# Patient Record
Sex: Female | Born: 1990 | Race: Black or African American | Hispanic: No | Marital: Single | Smoking: Current every day smoker
Health system: Southern US, Community
[De-identification: ages and names within clinical notes are randomized; demographics above are authoritative.]

## PROBLEM LIST (undated history)

## (undated) DIAGNOSIS — J329 Chronic sinusitis, unspecified: Secondary | ICD-10-CM

## (undated) DIAGNOSIS — K219 Gastro-esophageal reflux disease without esophagitis: Secondary | ICD-10-CM

## (undated) HISTORY — PX: NASAL SINUS SURGERY: SHX719

## (undated) HISTORY — PX: TONSILLECTOMY: SUR1361

## (undated) HISTORY — PX: MOUTH SURGERY: SHX715

## (undated) HISTORY — PX: ADENOIDECTOMY: SUR15

---

## 2011-04-06 ENCOUNTER — Encounter (HOSPITAL_BASED_OUTPATIENT_CLINIC_OR_DEPARTMENT_OTHER): Payer: Self-pay

## 2011-04-06 ENCOUNTER — Emergency Department (HOSPITAL_BASED_OUTPATIENT_CLINIC_OR_DEPARTMENT_OTHER)
Admission: EM | Admit: 2011-04-06 | Discharge: 2011-04-07 | Disposition: A | Discharge status: Home / Self Care | Payer: Self-pay | Attending: Emergency Medicine | Admitting: Emergency Medicine

## 2011-04-06 DIAGNOSIS — M7989 Other specified soft tissue disorders: Secondary | ICD-10-CM | POA: Insufficient documentation

## 2011-04-06 DIAGNOSIS — R609 Edema, unspecified: Secondary | ICD-10-CM | POA: Insufficient documentation

## 2011-04-06 DIAGNOSIS — J45909 Unspecified asthma, uncomplicated: Secondary | ICD-10-CM | POA: Insufficient documentation

## 2011-04-06 DIAGNOSIS — L0390 Cellulitis, unspecified: Secondary | ICD-10-CM

## 2011-04-06 DIAGNOSIS — IMO0002 Reserved for concepts with insufficient information to code with codable children: Secondary | ICD-10-CM | POA: Insufficient documentation

## 2011-04-06 DIAGNOSIS — R209 Unspecified disturbances of skin sensation: Secondary | ICD-10-CM | POA: Insufficient documentation

## 2011-04-06 DIAGNOSIS — M79609 Pain in unspecified limb: Secondary | ICD-10-CM | POA: Insufficient documentation

## 2011-04-06 MED ORDER — KETOROLAC TROMETHAMINE 30 MG/ML IJ SOLN
30.0000 mg | Freq: Once | INTRAMUSCULAR | Status: AC
  Administered 2011-04-06: 30 mg via INTRAVENOUS
  Filled 2011-04-06: qty 1

## 2011-04-06 MED ORDER — IBUPROFEN 800 MG PO TABS: 800.0000 mg | ORAL_TABLET | Freq: Three times a day (TID) | ORAL | Status: AC

## 2011-04-06 MED ORDER — CLINDAMYCIN HCL 150 MG PO CAPS: 150.0000 mg | ORAL_CAPSULE | Freq: Four times a day (QID) | ORAL | Status: AC

## 2011-04-06 MED ORDER — SODIUM CHLORIDE 0.9 % IV BOLUS (SEPSIS)
1000.0000 mL | Freq: Once | INTRAVENOUS | Status: AC
  Administered 2011-04-06: 1000 mL via INTRAVENOUS

## 2011-04-06 MED ORDER — VANCOMYCIN HCL IN DEXTROSE 1-5 GM/200ML-% IV SOLN
1000.0000 mg | Freq: Once | INTRAVENOUS | Status: AC
  Administered 2011-04-06: 1000 mg via INTRAVENOUS
  Filled 2011-04-06: qty 200

## 2011-04-06 NOTE — ED Notes (Signed)
Rednes, swelling to right upper arm x 2 days-? Insect bite

## 2011-04-06 NOTE — Discharge Instructions (Signed)
Cellulitis Cellulitis is an infection of the skin and the tissue beneath it. The area is typically red and tender. It is caused by germs (bacteria) (usually staph or strep) that enter the body through cuts or sores. Cellulitis most commonly occurs in the arms or lower legs.  HOME CARE INSTRUCTIONS   If you are given a prescription for medications which kill germs (antibiotics), take as directed until finished.   If the infection is on the arm or leg, keep the limb elevated as able.   Use a warm cloth several times per day to relieve pain and encourage healing.   See your caregiver for recheck of the infected site as directed if problems arise.   Only take over-the-counter or prescription medicines for pain, discomfort, or fever as directed by your caregiver.  SEEK MEDICAL CARE IF:   The area of redness (inflammation) is spreading, there are red streaks coming from the infected site, or if a part of the infection begins to turn dark in color.   The joint or bone underneath the infected skin becomes painful after the skin has healed.   The infection returns in the same or another area after it seems to have gone away.   A boil or bump swells up. This may be an abscess.   New, unexplained problems such as pain or fever develop.  SEEK IMMEDIATE MEDICAL CARE IF:   You have a fever.   You or your child feels drowsy or lethargic.   There is vomiting, diarrhea, or lasting discomfort or feeling ill (malaise) with muscle aches and pains.  MAKE SURE YOU:   Understand these instructions.   Will watch your condition.   Will get help right away if you are not doing well or get worse.

## 2011-04-06 NOTE — ED Provider Notes (Signed)
This chart was scribed for Gina Nielsen, MD by Wallis Mart. The patient was seen in room MH03/MH03 and the patient's care was started at 11:02 PM.   CSN: [id number]  Arrival date & time 04/06/11  2155   First MD Initiated Contact with Patient 04/06/11 2254      Chief Complaint  Patient presents with  . Cellulitis    (Consider location/radiation/quality/duration/timing/severity/associated sxs/prior treatment) HPI Gina Lynn is a 21 y.o. female who presents to the Emergency Department complaining of sudden onset, persistence of constant, gradually worsening, moderate to severe rash onset 2 days ago upon waking up. Pt states that the rash started small but has been increasing in size.  Pt c/o associated shooting pain to affected area, pain increases when rash is touched, there are no alleviating factors. Pt feels tingling in the tips of her fingers.   Pt put ice on the rash and took benadryl w/ no relief of sx.  Pt denies cp, sob. Pt has no known sick contacts. There are no other associated symptoms and no other alleviating or aggravating factors.   Past Medical History  Diagnosis Date  . Asthma     Past Surgical History  Procedure Date  . Nasal sinus surgery   . Tonsillectomy   . Adenoidectomy   . Mouth surgery     No family history on file.  History  Substance Use Topics  . Smoking status: Never Smoker   . Smokeless tobacco: Not on file  . Alcohol Use: No    OB History    Grav Para Term Preterm Abortions TAB SAB Ect Mult Living                  Review of Systems 10 Systems reviewed and are negative for acute change except as noted in the HPI.  Allergies  Review of patient's allergies indicates no known allergies.  Home Medications   Current Outpatient Rx  Name Route Sig Dispense Refill  . ALBUTEROL SULFATE HFA 108 (90 BASE) MCG/ACT IN AERS Inhalation Inhale into the lungs every 6 (six) hours as needed.    Marland Kitchen BENADRYL ALLERGY PO Oral Take 2 tablets by  mouth daily as needed. Patient used this medication for the swelling in her arm.      BP 149/90  Pulse 120  Temp(Src) 98.1 F (36.7 C) (Oral)  Resp 20  Ht 5\' 8"  (1.727 m)  Wt 190 lb (86.183 kg)  BMI 28.89 kg/m2  SpO2 99%  LMP 04/04/2011  Physical Exam  Nursing note and vitals reviewed. Constitutional: She is oriented to person, place, and time. She appears well-developed and well-nourished. No distress.  HENT:  Head: Normocephalic and atraumatic.  Eyes: EOM are normal. Pupils are equal, round, and reactive to light.  Neck: Neck supple. No tracheal deviation present.  Cardiovascular: Normal rate and regular rhythm.   Pulmonary/Chest: Effort normal and breath sounds normal. No respiratory distress.  Abdominal: Soft. She exhibits no distension.  Musculoskeletal: Normal range of motion. She exhibits edema and tenderness.       right anterior bicep area with erythema, swelling, tenderness consistent w/ cellulitis, no streaking lymphangitis, distal neurvascualar in tact   Neurological: She is alert and oriented to person, place, and time. No sensory deficit.  Skin: Skin is warm and dry.  Psychiatric: She has a normal mood and affect. Her behavior is normal.    ED Course  Procedures (including critical care time) DIAGNOSTIC STUDIES: Oxygen Saturation is 99% on room air, normal by  my interpretation.    COORDINATION OF CARE:   Medications      vancomycin (VANCOCIN) IVPB 1000 mg/200 mL premix (1000 mg Intravenous Given 04/06/11 2317)  sodium chloride 0.9 % bolus 1,000 mL (1000 mL Intravenous Given 04/06/11 2316)  ketorolac (TORADOL) 30 MG/ML injection 30 mg (30 mg Intravenous Given 04/06/11 2316)   11:05: Pt to follow up with ED in 48 hours.    IV vancomycin. Wound ink outlined.    MDM  Clinical cellulitis right arm. Treated with IV antibiotics and plan recheck 48 hours. No indication for blood work. No fevers. No vomiting. Stable for outpatient treatment at this time. Reliable  historian states understanding all discharge and followup instructions and is stable for discharge home at this time. Tachycardia improved.   I personally performed the services described in this documentation, which was scribed in my presence. The recorded information has been reviewed and considered.       Gina Nielsen, MD 04/07/11 [phone]

## 2011-04-07 NOTE — ED Notes (Signed)
Area to right upper arm outlined per MD order.

## 2011-04-10 ENCOUNTER — Encounter (HOSPITAL_BASED_OUTPATIENT_CLINIC_OR_DEPARTMENT_OTHER): Payer: Self-pay

## 2011-04-10 ENCOUNTER — Emergency Department (HOSPITAL_BASED_OUTPATIENT_CLINIC_OR_DEPARTMENT_OTHER)
Admission: EM | Admit: 2011-04-10 | Discharge: 2011-04-10 | Disposition: A | Discharge status: Home / Self Care | Payer: Self-pay | Attending: Emergency Medicine | Admitting: Emergency Medicine

## 2011-04-10 DIAGNOSIS — IMO0002 Reserved for concepts with insufficient information to code with codable children: Secondary | ICD-10-CM | POA: Insufficient documentation

## 2011-04-10 DIAGNOSIS — J45909 Unspecified asthma, uncomplicated: Secondary | ICD-10-CM | POA: Insufficient documentation

## 2011-04-10 DIAGNOSIS — L0390 Cellulitis, unspecified: Secondary | ICD-10-CM

## 2011-04-10 NOTE — ED Provider Notes (Signed)
History     CSN: [id number]  Arrival date & time 04/10/11  1704   First MD Initiated Contact with Patient 04/10/11 1723      Chief Complaint  Patient presents with  . Arm Swelling  . Arm Pain    (Consider location/radiation/quality/duration/timing/severity/associated sxs/prior treatment) HPI Comments: Pt state that the area of redness started on 3/9 and then she was seen in the ER 3 days ago:pt states that the ares is less hot and swollen although the redness is still there  Patient is a 21 y.o. female presenting with arm pain. The history is provided by the patient. No language interpreter was used.  Arm Pain This is a new problem. The current episode started in the past 7 days. The problem occurs constantly. The problem has been gradually improving. Pertinent negatives include no numbness or weakness. The symptoms are aggravated by nothing. She has tried NSAIDs (antibiotics) for the symptoms. The treatment provided moderate relief.    Past Medical History  Diagnosis Date  . Asthma     Past Surgical History  Procedure Date  . Nasal sinus surgery   . Tonsillectomy   . Adenoidectomy   . Mouth surgery     No family history on file.  History  Substance Use Topics  . Smoking status: Never Smoker   . Smokeless tobacco: Not on file  . Alcohol Use: No    OB History    Grav Para Term Preterm Abortions TAB SAB Ect Mult Living                  Review of Systems  Neurological: Negative for weakness and numbness.  All other systems reviewed and are negative.    Allergies  Review of patient's allergies indicates no known allergies.  Home Medications   Current Outpatient Rx  Name Route Sig Dispense Refill  . CLINDAMYCIN HCL 150 MG PO CAPS Oral Take 1 capsule (150 mg total) by mouth every 6 (six) hours. 28 capsule 0  . IBUPROFEN 800 MG PO TABS Oral Take 1 tablet (800 mg total) by mouth 3 (three) times daily. 21 tablet 0  . ALBUTEROL SULFATE HFA 108 (90 BASE) MCG/ACT  IN AERS Inhalation Inhale into the lungs every 6 (six) hours as needed.    Marland Kitchen BENADRYL ALLERGY PO Oral Take 2 tablets by mouth daily as needed. Patient used this medication for the swelling in her arm.      BP 125/76  Pulse 103  Temp(Src) 98.2 F (36.8 C) (Oral)  Resp 20  Ht 5' 7.5" (1.715 m)  Wt 192 lb (87.091 kg)  BMI 29.63 kg/m2  SpO2 100%  LMP 04/04/2011  Physical Exam  Nursing note and vitals reviewed. Constitutional: She is oriented to person, place, and time. She appears well-developed and well-nourished.  Cardiovascular: Normal rate and regular rhythm.   Pulmonary/Chest: Effort normal and breath sounds normal.  Musculoskeletal: Normal range of motion.  Neurological: She is alert and oriented to person, place, and time.  Skin:       Pt has a patch of mild redness noted to the right upper arm that is within the lines drawn 3 days ago:no warmth or swelling noted to the area    ED Course  Procedures (including critical care time)  Labs Reviewed - No data to display No results found.   1. Cellulitis       MDM  Area appears to be getting better:pt to continue antibiotics  Teressa Lower, NP 04/10/11 1752

## 2011-04-10 NOTE — Discharge Instructions (Signed)
Cellulitis Cellulitis is an infection of the tissue under the skin. The infected area is usually red and tender. This is caused by germs. These germs enter the body through cuts or sores. This usually happens in the arms or lower legs. HOME CARE   Take your medicine as told. Finish it even if you start to feel better.   If the infection is on the arm or leg, keep it raised (elevated).   Use a warm cloth on the infected area several times a day.   See your doctor for a follow-up visit as told.  GET HELP RIGHT AWAY IF:   You are tired or confused.   You throw up (vomit).   You have watery poop (diarrhea).   You feel ill and have muscle aches.   You have a fever.  MAKE SURE YOU:   Understand these instructions.   Will watch your condition.   Will get help right away if you are not doing well or get worse.  Document Released: 07/01/2007 Document Revised: 01/01/2011 Document Reviewed: 12/14/2008 [employer] Patient Information 2012 [employer]. 

## 2011-04-10 NOTE — ED Notes (Signed)
Right arm redness, swelling and pain since 04/04/11. Recent diagnosis of cellulitis without improvement.

## 2011-04-13 NOTE — ED Provider Notes (Signed)
History/physical exam/procedure(s) were performed by non-physician practitioner and as supervising physician I was immediately available for consultation/collaboration. I have reviewed all notes and am in agreement with care and plan.   Hilario Quarry, MD 04/13/11 [phone]

## 2011-12-20 ENCOUNTER — Emergency Department (HOSPITAL_BASED_OUTPATIENT_CLINIC_OR_DEPARTMENT_OTHER)
Admission: EM | Admit: 2011-12-20 | Discharge: 2011-12-20 | Disposition: A | Discharge status: Home / Self Care | Payer: Self-pay | Attending: Emergency Medicine | Admitting: Emergency Medicine

## 2011-12-20 ENCOUNTER — Encounter (HOSPITAL_BASED_OUTPATIENT_CLINIC_OR_DEPARTMENT_OTHER): Payer: Self-pay

## 2011-12-20 DIAGNOSIS — K219 Gastro-esophageal reflux disease without esophagitis: Secondary | ICD-10-CM | POA: Insufficient documentation

## 2011-12-20 DIAGNOSIS — J45909 Unspecified asthma, uncomplicated: Secondary | ICD-10-CM | POA: Insufficient documentation

## 2011-12-20 DIAGNOSIS — Z79899 Other long term (current) drug therapy: Secondary | ICD-10-CM | POA: Insufficient documentation

## 2011-12-20 HISTORY — DX: Gastro-esophageal reflux disease without esophagitis: K21.9

## 2011-12-20 MED ORDER — OMEPRAZOLE 20 MG PO CPDR: 20.0000 mg | DELAYED_RELEASE_CAPSULE | Freq: Every day | ORAL | Status: DC

## 2011-12-20 MED ORDER — GI COCKTAIL ~~LOC~~
30.0000 mL | Freq: Once | ORAL | Status: AC
  Administered 2011-12-20: 30 mL via ORAL

## 2011-12-20 MED ORDER — GI COCKTAIL ~~LOC~~
ORAL | Status: AC
  Filled 2011-12-20: qty 30

## 2011-12-20 NOTE — ED Notes (Signed)
Patient reports that she was awakened from sleep with reflux, denies spicy food this evening, burning in throat since 0100. Use to take prevacid for same but no longer prescribed.

## 2011-12-20 NOTE — ED Provider Notes (Signed)
History     CSN: [id number]  Arrival date & time 12/20/11  0207   First MD Initiated Contact with Patient 12/20/11 [phone]      Chief Complaint  Patient presents with  . Gastrophageal Reflux    (Consider location/radiation/quality/duration/timing/severity/associated sxs/prior treatment) Patient is a 21 y.o. female presenting with GERD. The history is provided by the patient. No language interpreter was used.  Gastrophageal Reflux This is a recurrent problem. The current episode started 1 to 2 hours ago. The problem occurs constantly. The problem has not changed since onset.Pertinent negatives include no chest pain, no abdominal pain, no headaches and no shortness of breath. The symptoms are aggravated by eating. Nothing relieves the symptoms. She has tried nothing for the symptoms. The treatment provided no relief.  Stopped her prevacid and ate pizza tonight now has burning and bitter acid like taste in the back of the throat.    Past Medical History  Diagnosis Date  . Asthma   . GERD (gastroesophageal reflux disease)     Past Surgical History  Procedure Date  . Nasal sinus surgery   . Tonsillectomy   . Adenoidectomy   . Mouth surgery     No family history on file.  History  Substance Use Topics  . Smoking status: Never Smoker   . Smokeless tobacco: Not on file  . Alcohol Use: No    OB History    Grav Para Term Preterm Abortions TAB SAB Ect Mult Living                  Review of Systems  Respiratory: Negative for shortness of breath.   Cardiovascular: Negative for chest pain, palpitations and leg swelling.  Gastrointestinal: Negative for vomiting and abdominal pain.  Neurological: Negative for headaches.  All other systems reviewed and are negative.    Allergies  Review of patient's allergies indicates no known allergies.  Home Medications   Current Outpatient Rx  Name  Route  Sig  Dispense  Refill  . RANITIDINE HCL 75 MG PO TABS   Oral   Take 75 mg by  mouth once.         . ALBUTEROL SULFATE HFA 108 (90 BASE) MCG/ACT IN AERS   Inhalation   Inhale into the lungs every 6 (six) hours as needed.         Marland Kitchen BENADRYL ALLERGY PO   Oral   Take 2 tablets by mouth daily as needed. Patient used this medication for the swelling in her arm.           BP 146/92  Pulse 107  Temp 98.3 F (36.8 C) (Oral)  Resp 18  SpO2 100%  Physical Exam  Constitutional: She is oriented to person, place, and time. She appears well-developed and well-nourished. No distress.  HENT:  Head: Normocephalic and atraumatic.  Mouth/Throat: Oropharynx is clear and moist.  Eyes: Conjunctivae normal are normal. Pupils are equal, round, and reactive to light.  Neck: Normal range of motion. Neck supple.  Cardiovascular: Normal rate and regular rhythm.   Pulmonary/Chest: Effort normal and breath sounds normal. She has no wheezes. She has no rales.  Abdominal: Soft. Bowel sounds are normal. There is no tenderness. There is no rebound and no guarding.  Musculoskeletal: Normal range of motion.  Neurological: She is alert and oriented to person, place, and time.  Skin: Skin is warm and dry.  Psychiatric: She has a normal mood and affect.    ED Course  Procedures (including  critical care time)  Labs Reviewed - No data to display No results found.   No diagnosis found.    MDM  Diet for GERD no eating 3 hours before bed.  Head of bed up.  Follow up with your PMD for ongoing care       Adriell Polansky K Charonda Hefter-Rasch, MD 12/20/11 [phone]

## 2012-07-12 ENCOUNTER — Emergency Department (HOSPITAL_BASED_OUTPATIENT_CLINIC_OR_DEPARTMENT_OTHER): Payer: Self-pay

## 2012-07-12 ENCOUNTER — Emergency Department (HOSPITAL_BASED_OUTPATIENT_CLINIC_OR_DEPARTMENT_OTHER)
Admission: EM | Admit: 2012-07-12 | Discharge: 2012-07-12 | Disposition: A | Discharge status: Home / Self Care | Payer: Self-pay | Attending: Emergency Medicine | Admitting: Emergency Medicine

## 2012-07-12 ENCOUNTER — Encounter (HOSPITAL_BASED_OUTPATIENT_CLINIC_OR_DEPARTMENT_OTHER): Payer: Self-pay

## 2012-07-12 DIAGNOSIS — R0602 Shortness of breath: Secondary | ICD-10-CM | POA: Insufficient documentation

## 2012-07-12 DIAGNOSIS — R0682 Tachypnea, not elsewhere classified: Secondary | ICD-10-CM | POA: Insufficient documentation

## 2012-07-12 DIAGNOSIS — R000 Tachycardia, unspecified: Secondary | ICD-10-CM | POA: Insufficient documentation

## 2012-07-12 DIAGNOSIS — J3489 Other specified disorders of nose and nasal sinuses: Secondary | ICD-10-CM | POA: Insufficient documentation

## 2012-07-12 DIAGNOSIS — Z79899 Other long term (current) drug therapy: Secondary | ICD-10-CM | POA: Insufficient documentation

## 2012-07-12 DIAGNOSIS — R0789 Other chest pain: Secondary | ICD-10-CM | POA: Insufficient documentation

## 2012-07-12 DIAGNOSIS — Z8709 Personal history of other diseases of the respiratory system: Secondary | ICD-10-CM | POA: Insufficient documentation

## 2012-07-12 DIAGNOSIS — Z9889 Other specified postprocedural states: Secondary | ICD-10-CM | POA: Insufficient documentation

## 2012-07-12 DIAGNOSIS — J45901 Unspecified asthma with (acute) exacerbation: Secondary | ICD-10-CM | POA: Insufficient documentation

## 2012-07-12 DIAGNOSIS — J4521 Mild intermittent asthma with (acute) exacerbation: Secondary | ICD-10-CM

## 2012-07-12 DIAGNOSIS — K219 Gastro-esophageal reflux disease without esophagitis: Secondary | ICD-10-CM | POA: Insufficient documentation

## 2012-07-12 HISTORY — DX: Chronic sinusitis, unspecified: J32.9

## 2012-07-12 LAB — BASIC METABOLIC PANEL
BUN: 6 mg/dL (ref 6–23)
CO2: 20 mEq/L (ref 19–32)
Calcium: 9.9 mg/dL (ref 8.4–10.5)
Chloride: 106 mEq/L (ref 96–112)
Creatinine, Ser: 0.7 mg/dL (ref 0.50–1.10)
Glucose, Bld: 101 mg/dL — ABNORMAL HIGH (ref 70–99)

## 2012-07-12 LAB — CBC WITH DIFFERENTIAL/PLATELET
Basophils Absolute: 0 10*3/uL (ref 0.0–0.1)
Eosinophils Relative: 1 % (ref 0–5)
Lymphs Abs: 1.1 10*3/uL (ref 0.7–4.0)
MCH: 28.3 pg (ref 26.0–34.0)
MCHC: 34 g/dL (ref 30.0–36.0)
Monocytes Absolute: 1 10*3/uL (ref 0.1–1.0)
Monocytes Relative: 7 % (ref 3–12)
Neutro Abs: 11.7 10*3/uL — ABNORMAL HIGH (ref 1.7–7.7)
Neutrophils Relative %: 84 % — ABNORMAL HIGH (ref 43–77)
Platelets: 286 10*3/uL (ref 150–400)
RBC: 4.41 MIL/uL (ref 3.87–5.11)

## 2012-07-12 MED ORDER — ALBUTEROL SULFATE HFA 108 (90 BASE) MCG/ACT IN AERS
2.0000 | INHALATION_SPRAY | RESPIRATORY_TRACT | Status: DC
  Administered 2012-07-12: 2 via RESPIRATORY_TRACT

## 2012-07-12 MED ORDER — LEVALBUTEROL HCL 0.63 MG/3ML IN NEBU
0.6300 mg | INHALATION_SOLUTION | Freq: Once | RESPIRATORY_TRACT | Status: AC
  Administered 2012-07-12: 0.63 mg via RESPIRATORY_TRACT
  Filled 2012-07-12: qty 3

## 2012-07-12 MED ORDER — PREDNISONE 20 MG PO TABS: 60.0000 mg | ORAL_TABLET | Freq: Every day | ORAL | Status: DC

## 2012-07-12 MED ORDER — SODIUM CHLORIDE 0.9 % IV SOLN
1000.0000 mL | Freq: Once | INTRAVENOUS | Status: DC
Start: 2012-07-12 — End: 2012-07-13

## 2012-07-12 MED ORDER — LEVALBUTEROL HCL 0.63 MG/3ML IN NEBU: 0.6300 mg | INHALATION_SOLUTION | RESPIRATORY_TRACT | Status: DC | PRN

## 2012-07-12 MED ORDER — ALBUTEROL SULFATE HFA 108 (90 BASE) MCG/ACT IN AERS
INHALATION_SPRAY | RESPIRATORY_TRACT | Status: AC
  Filled 2012-07-12: qty 6.7

## 2012-07-12 MED ORDER — SODIUM CHLORIDE 0.9 % IV SOLN
1000.0000 mL | Freq: Once | INTRAVENOUS | Status: AC
  Administered 2012-07-12: 1000 mL via INTRAVENOUS

## 2012-07-12 MED ORDER — SODIUM CHLORIDE 0.9 % IV SOLN
1000.0000 mL | INTRAVENOUS | Status: DC
  Administered 2012-07-12 (×2): 1000 mL via INTRAVENOUS

## 2012-07-12 MED ORDER — METHYLPREDNISOLONE SODIUM SUCC 125 MG IJ SOLR
125.0000 mg | Freq: Once | INTRAMUSCULAR | Status: AC
  Administered 2012-07-12: 125 mg via INTRAVENOUS
  Filled 2012-07-12: qty 2

## 2012-07-12 NOTE — ED Notes (Signed)
C/o nonprod cough, chest tightness x 2 days

## 2012-07-12 NOTE — ED Notes (Signed)
MD at bedside. 

## 2012-07-12 NOTE — ED Provider Notes (Signed)
History   CSN: [id number] Arrival date & time 07/12/12  2020 First MD Initiated Contact with Patient 07/12/12 2036      Chief Complaint  Patient presents with  . Cough  . Asthma    HPI The patient presents to emergency room with complaints of cough, chest tightness and shortness of breath the last 2 days. She has a history of asthma. She has been using her inhalers frequently without much relief. She has pain in her chest when she takes a deep breath. She's also had sinus congestion and nasal drainage. She denies any history of abdominal pain, vomiting or diarrhea. She has not had a fever. She has not noticed any leg swelling. She denies any history of PE or DVT. Past Medical History  Diagnosis Date  . Asthma   . GERD (gastroesophageal reflux disease)   . Sinusitis     Past Surgical History  Procedure Laterality Date  . Nasal sinus surgery    . Tonsillectomy    . Adenoidectomy    . Mouth surgery      No family history on file.  History  Substance Use Topics  . Smoking status: Never Smoker   . Smokeless tobacco: Not on file  . Alcohol Use: No    OB History   Grav Para Term Preterm Abortions TAB SAB Ect Mult Living                  Review of Systems  All other systems reviewed and are negative.    Allergies  Review of patient's allergies indicates no known allergies.  Home Medications   Current Outpatient Rx  Name  Route  Sig  Dispense  Refill  . albuterol (PROVENTIL HFA;VENTOLIN HFA) 108 (90 BASE) MCG/ACT inhaler   Inhalation   Inhale into the lungs every 6 (six) hours as needed.         . DiphenhydrAMINE HCl (BENADRYL ALLERGY PO)   Oral   Take 2 tablets by mouth daily as needed. Patient used this medication for the swelling in her arm.         Marland Kitchen omeprazole (PRILOSEC) 20 MG capsule   Oral   Take 1 capsule (20 mg total) by mouth daily.   30 capsule   0   . predniSONE (DELTASONE) 20 MG tablet   Oral   Take 3 tablets (60 mg total) by mouth  daily.   15 tablet   0   . ranitidine (ZANTAC) 75 MG tablet   Oral   Take 75 mg by mouth once.           BP 113/69  Pulse 110  Temp(Src) 98.6 F (37 C) (Oral)  Resp 20  Ht 5\' 8"  (1.727 m)  Wt 190 lb (86.183 kg)  BMI 28.9 kg/m2  SpO2 100%  LMP 07/12/2012  Physical Exam  Nursing note and vitals reviewed. Constitutional: She appears well-developed and well-nourished. She appears distressed.  HENT:  Head: Normocephalic and atraumatic.  Right Ear: External ear normal.  Left Ear: External ear normal.  Eyes: Conjunctivae are normal. Right eye exhibits no discharge. Left eye exhibits no discharge. No scleral icterus.  Neck: Neck supple. No tracheal deviation present.  Cardiovascular: Regular rhythm and intact distal pulses.  Tachycardia present.   Pulmonary/Chest: Accessory muscle usage present. No stridor. Tachypnea noted. No respiratory distress. She has no decreased breath sounds. She has wheezes. She has no rales.  Abdominal: Soft. Bowel sounds are normal. She exhibits no distension. There is no  tenderness. There is no rebound and no guarding.  Musculoskeletal: She exhibits no edema and no tenderness.  Neurological: She is alert. She has normal strength. No sensory deficit. Cranial nerve deficit:  no gross defecits noted. She exhibits normal muscle tone. She displays no seizure activity. Coordination normal.  Skin: Skin is warm and dry. No rash noted.  Psychiatric: She has a normal mood and affect.    ED Course  Procedures (including critical care time) EKG Sinus tachycardia rate 1:30 Normal axis, normal intervals Normal ST T waves No prior EKG for comparison  1037pm Pt is feeling better after treatments.  Lungs clear.  Tachycardia decreasing.   1127 Pt is feeling much better, she is ready to go home. Labs Reviewed  CBC WITH DIFFERENTIAL - Abnormal; Notable for the following:    WBC 13.9 (*)    Neutrophils Relative % 84 (*)    Neutro Abs 11.7 (*)    Lymphocytes  Relative 8 (*)    All other components within normal limits  BASIC METABOLIC PANEL - Abnormal; Notable for the following:    Potassium 3.2 (*)    Glucose, Bld 101 (*)    All other components within normal limits  D-DIMER, QUANTITATIVE   Dg Chest Portable 1 View  07/12/2012   *RADIOLOGY REPORT*  Clinical Data: Cough and shortness of breath.  History of asthma.  PORTABLE CHEST - 1 VIEW  Comparison: No priors.  Findings: Lung volumes are normal.  No consolidative airspace disease.  No pleural effusions.  No pneumothorax.  No pulmonary nodule or mass noted.  Pulmonary vasculature and the cardiomediastinal silhouette are within normal limits.  IMPRESSION: 1. No radiographic evidence of acute cardiopulmonary disease.   Original Report Authenticated By: Trudie Reed, M.D.     1. Asthma exacerbation attacks, mild intermittent       MDM  The patient improved significantly after treatment for her asthma I suspect all her dyspnea was related to bronchospasm. There is no evidence of pneumonia. I doubt pulmonary embolism. Patient was given an inhaler. She'll be discharged home on a course of steroids. She should follow up with her primary Dr. to make sure she is improving        Celene Kras, MD 07/12/12 2332

## 2012-07-28 ENCOUNTER — Ambulatory Visit: Payer: Self-pay | Attending: Family Medicine | Admitting: Internal Medicine

## 2012-07-28 MED ORDER — FLUTICASONE PROPIONATE HFA 44 MCG/ACT IN AERO: 2.0000 | INHALATION_SPRAY | Freq: Two times a day (BID) | RESPIRATORY_TRACT | Status: DC

## 2012-07-28 NOTE — Progress Notes (Unsigned)
Patient ID: Gina Lynn, female   DOB: 09/11/1990, 22 y.o.   MRN: [id number]  CC:  HPI: 22 year old female who Is here for evaluation of her asthma. She was treated for her asthma exacerbation with a five-day course of prednisone and albuterol inhaler. She denies any shortness of breath but complains of some wheezing early in the morning, she also has a postnasal drip and a productive cough in the morning She states that she needs a full physical for a job and needs to see somebody within the week    No Known Allergies Past Medical History  Diagnosis Date  . Asthma   . GERD (gastroesophageal reflux disease)   . Sinusitis    Current Outpatient Prescriptions on File Prior to Visit  Medication Sig Dispense Refill  . albuterol (PROVENTIL HFA;VENTOLIN HFA) 108 (90 BASE) MCG/ACT inhaler Inhale into the lungs every 6 (six) hours as needed.      . DiphenhydrAMINE HCl (BENADRYL ALLERGY PO) Take 2 tablets by mouth daily as needed. Patient used this medication for the swelling in her arm.      . ranitidine (ZANTAC) 75 MG tablet Take 75 mg by mouth once as needed.        No current facility-administered medications on file prior to visit.   No family history on file. History   Social History  . Marital Status: Single    Spouse Name: N/A    Number of Children: N/A  . Years of Education: N/A   Occupational History  . Not on file.   Social History Main Topics  . Smoking status: Never Smoker   . Smokeless tobacco: Not on file  . Alcohol Use: No  . Drug Use: Not on file  . Sexually Active: Not on file   Other Topics Concern  . Not on file   Social History Narrative  . No narrative on file    Review of Systems  Constitutional: Negative for fever, chills, diaphoresis, activity change, appetite change and fatigue.  HENT: Negative for ear pain, nosebleeds, congestion, facial swelling, rhinorrhea, neck pain, neck stiffness and ear discharge.   Eyes: Negative for pain, discharge,  redness, itching and visual disturbance.  Respiratory: Negative for cough, choking, chest tightness, shortness of breath, wheezing and stridor.   Cardiovascular: Negative for chest pain, palpitations and leg swelling.  Gastrointestinal: Negative for abdominal distention.  Genitourinary: Negative for dysuria, urgency, frequency, hematuria, flank pain, decreased urine volume, difficulty urinating and dyspareunia.  Musculoskeletal: Negative for back pain, joint swelling, arthralgias and gait problem.  Neurological: Negative for dizziness, tremors, seizures, syncope, facial asymmetry, speech difficulty, weakness, light-headedness, numbness and headaches.  Hematological: Negative for adenopathy. Does not bruise/bleed easily.  Psychiatric/Behavioral: Negative for hallucinations, behavioral problems, confusion, dysphoric mood, decreased concentration and agitation.    Objective:   Filed Vitals:   07/28/12 1728  BP: 156/85  Pulse: 107    Physical Exam  Constitutional: Appears well-developed and well-nourished. No distress.  HENT: Normocephalic. External right and left ear normal. Oropharynx is clear and moist.  Eyes: Conjunctivae and EOM are normal. PERRLA, no scleral icterus.  Neck: Normal ROM. Neck supple. No JVD. No tracheal deviation. No thyromegaly.  CVS: RRR, S1/S2 +, no murmurs, no gallops, no carotid bruit.  Pulmonary: Effort and breath sounds normal, no stridor, rhonchi, wheezes, rales.  Abdominal: Soft. BS +,  no distension, tenderness, rebound or guarding.  Musculoskeletal: Normal range of motion. No edema and no tenderness.  Lymphadenopathy: No lymphadenopathy noted, cervical, inguinal. Neuro: Alert. Normal  reflexes, muscle tone coordination. No cranial nerve deficit. Skin: Skin is warm and dry. No rash noted. Not diaphoretic. No erythema. No pallor.  Psychiatric: Normal mood and affect. Behavior, judgment, thought content normal.   Lab Results  Component Value Date   WBC 13.9*  07/12/2012   HGB 12.5 07/12/2012   HCT 36.8 07/12/2012   MCV 83.4 07/12/2012   PLT 286 07/12/2012   Lab Results  Component Value Date   CREATININE 0.70 07/12/2012   BUN 6 07/12/2012   NA 139 07/12/2012   K 3.2* 07/12/2012   CL 106 07/12/2012   CO2 20 07/12/2012    No results found for this basename: HGBA1C   Lipid Panel  No results found for this basename: chol, trig, hdl, cholhdl, vldl, ldlcalc       Assessment and plan:   There are no active problems to display for this patient.  Moderate persistent asthma Start the patient on fluticasone Patient advised to use this twice a day rescue inhaler as needed Followup in one week for a full physical

## 2012-08-10 ENCOUNTER — Ambulatory Visit: Payer: Self-pay | Attending: Family Medicine | Admitting: Family Medicine

## 2012-08-10 VITALS — BP 124/84 | HR 100 | Temp 98.6°F | Resp 18 | Ht 68.0 in | Wt 202.0 lb

## 2012-08-10 DIAGNOSIS — J45909 Unspecified asthma, uncomplicated: Secondary | ICD-10-CM | POA: Insufficient documentation

## 2012-08-10 DIAGNOSIS — J4520 Mild intermittent asthma, uncomplicated: Secondary | ICD-10-CM | POA: Insufficient documentation

## 2012-08-10 NOTE — Progress Notes (Signed)
Patient ID: Gina Lynn, female   DOB: 12-Sep-1990, 22 y.o.   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  Pt reports that she is healthy and has controlled asthma.  She says that she is not using her inhalers for asthma and not using symptoms.  She had only used her inhaler when she got sick about a month ago and reports that many months pass without her ever using her inhaler.   No Known Allergies Past Medical History  Diagnosis Date  . Asthma   . GERD (gastroesophageal reflux disease)   . Sinusitis    Current Outpatient Prescriptions on File Prior to Visit  Medication Sig Dispense Refill  . albuterol (PROVENTIL HFA;VENTOLIN HFA) 108 (90 BASE) MCG/ACT inhaler Inhale into the lungs every 6 (six) hours as needed.      . DiphenhydrAMINE HCl (BENADRYL ALLERGY PO) Take 2 tablets by mouth daily as needed. Patient used this medication for the swelling in her arm.      . fluticasone (FLOVENT HFA) 44 MCG/ACT inhaler Inhale 2 puffs into the lungs 2 (two) times daily.  1 Inhaler  12  . omeprazole (PRILOSEC) 20 MG capsule Take 20 mg by mouth daily as needed.      . ranitidine (ZANTAC) 75 MG tablet Take 75 mg by mouth once as needed.        No current facility-administered medications on file prior to visit.   History reviewed. No pertinent family history. History   Social History  . Marital Status: Single    Spouse Name: N/A    Number of Children: N/A  . Years of Education: N/A   Occupational History  . Not on file.   Social History Main Topics  . Smoking status: Never Smoker   . Smokeless tobacco: Not on file  . Alcohol Use: No  . Drug Use: Not on file  . Sexually Active: Not on file   Other Topics Concern  . Not on file   Social History Narrative  . No narrative on file    Review of Systems  Constitutional: Negative for fever, chills, diaphoresis, activity change, appetite change and fatigue.  HENT:  Negative for ear pain, nosebleeds, congestion, facial swelling, rhinorrhea, neck pain, neck stiffness and ear discharge.   Eyes: Negative for pain, discharge, redness, itching and visual disturbance.  Respiratory: Negative for cough, choking, chest tightness, shortness of breath, wheezing and stridor.   Cardiovascular: Negative for chest pain, palpitations and leg swelling.  Gastrointestinal: Negative for abdominal distention.  Genitourinary: Negative for dysuria, urgency, frequency, hematuria, flank pain, decreased urine volume, difficulty urinating and dyspareunia.  Musculoskeletal: Negative for back pain, joint swelling, arthralgias and gait problem.  Neurological: Negative for dizziness, tremors, seizures, syncope, facial asymmetry, speech difficulty, weakness, light-headedness, numbness and headaches.  Hematological: Negative for adenopathy. Does not bruise/bleed easily.  Psychiatric/Behavioral: Negative for hallucinations, behavioral problems, confusion, dysphoric mood, decreased concentration and agitation.    Objective:   Filed Vitals:   08/10/12 0937  BP: 124/84  Pulse: 100  Temp:   Resp:     Physical Exam  Constitutional: Appears well-developed and well-nourished. No distress.  HENT: Normocephalic. External right and left ear normal. Oropharynx is clear and moist.  Eyes: Conjunctivae and EOM are normal. PERRLA, no scleral icterus.  Neck: Normal ROM. Neck supple. No JVD. No tracheal deviation. No thyromegaly.  CVS: RRR, S1/S2 +, no murmurs, no gallops, no carotid  bruit.  Pulmonary: Effort and breath sounds normal, no stridor, rhonchi, wheezes, rales.  Abdominal: Soft. BS +,  no distension, tenderness, rebound or guarding.  Musculoskeletal: Normal range of motion. No edema and no tenderness.  Lymphadenopathy: No lymphadenopathy noted, cervical, inguinal. Neuro: Alert. Normal reflexes, muscle tone coordination. No cranial nerve deficit. Skin: Skin is warm and dry. No rash  noted. Not diaphoretic. No erythema. No pallor.  Psychiatric: Normal mood and affect. Behavior, judgment, thought content normal.   Lab Results  Component Value Date   WBC 13.9* 07/12/2012   HGB 12.5 07/12/2012   HCT 36.8 07/12/2012   MCV 83.4 07/12/2012   PLT 286 07/12/2012   Lab Results  Component Value Date   CREATININE 0.70 07/12/2012   BUN 6 07/12/2012   NA 139 07/12/2012   K 3.2* 07/12/2012   CL 106 07/12/2012   CO2 20 07/12/2012    No results found for this basename: HGBA1C   Lipid Panel  No results found for this basename: chol, trig, hdl, cholhdl, vldl, ldlcalc       Assessment and plan:   Patient Active Problem List   Diagnosis Date Noted  . Asthma, mild intermittent 08/10/2012   Physical completed today  Pt declined to have PPD done today  See Form completed in office  The patient was given clear instructions to go to ER or return to medical center if symptoms don't improve, worsen or new problems develop.  The patient verbalized understanding.  The patient was told to call to get any lab results if not heard anything in the next week.    RTC in 6 months  Rodney Langton, MD, CDE, FAAFP [facility] [place], [place]

## 2012-08-10 NOTE — Progress Notes (Signed)
Patient presents for physical for pre employment; has form that needs to be filled out by physician; states she does not need a pelvic.

## 2012-08-10 NOTE — Patient Instructions (Signed)

## 2012-11-21 ENCOUNTER — Emergency Department (HOSPITAL_BASED_OUTPATIENT_CLINIC_OR_DEPARTMENT_OTHER)
Admission: EM | Admit: 2012-11-21 | Discharge: 2012-11-21 | Disposition: A | Discharge status: Home / Self Care | Payer: Self-pay | Attending: Emergency Medicine | Admitting: Emergency Medicine

## 2012-11-21 ENCOUNTER — Encounter (HOSPITAL_BASED_OUTPATIENT_CLINIC_OR_DEPARTMENT_OTHER): Payer: Self-pay | Admitting: Emergency Medicine

## 2012-11-21 DIAGNOSIS — J45909 Unspecified asthma, uncomplicated: Secondary | ICD-10-CM | POA: Insufficient documentation

## 2012-11-21 DIAGNOSIS — IMO0002 Reserved for concepts with insufficient information to code with codable children: Secondary | ICD-10-CM | POA: Insufficient documentation

## 2012-11-21 DIAGNOSIS — K089 Disorder of teeth and supporting structures, unspecified: Secondary | ICD-10-CM | POA: Insufficient documentation

## 2012-11-21 DIAGNOSIS — K219 Gastro-esophageal reflux disease without esophagitis: Secondary | ICD-10-CM | POA: Insufficient documentation

## 2012-11-21 DIAGNOSIS — Z79899 Other long term (current) drug therapy: Secondary | ICD-10-CM | POA: Insufficient documentation

## 2012-11-21 DIAGNOSIS — K0889 Other specified disorders of teeth and supporting structures: Secondary | ICD-10-CM

## 2012-11-21 MED ORDER — HYDROCODONE-ACETAMINOPHEN 5-325 MG PO TABS
1.0000 | ORAL_TABLET | Freq: Once | ORAL | Status: AC
  Administered 2012-11-21: 1 via ORAL
  Filled 2012-11-21: qty 1

## 2012-11-21 MED ORDER — HYDROCODONE-ACETAMINOPHEN 5-325 MG PO TABS: 2.0000 | ORAL_TABLET | ORAL | Status: DC | PRN

## 2012-11-21 MED ORDER — PENICILLIN V POTASSIUM 500 MG PO TABS: 500.0000 mg | ORAL_TABLET | Freq: Three times a day (TID) | ORAL | Status: DC

## 2012-11-21 NOTE — ED Provider Notes (Signed)
CSN: [id number]     Arrival date & time 11/21/12  1603 History   First MD Initiated Contact with Patient 11/21/12 1722     Chief Complaint  Patient presents with  . Dental Pain   (Consider location/radiation/quality/duration/timing/severity/associated sxs/prior Treatment) HPI  22 year old female presents complaining of dental pain. Patient reports acute onset of sharp throbbing pain to of right upper tooth 3 days ago after she was eating. States she chipped a tooth while chewing. Pain is intense, worsening with cold air. Pain improves somewhat with Tylenol, aspirin, and a Vicodin that she was given this morning. No complaints of fever, chills, ear pain, sore throat, neck pain, or rash. Denies any recent trauma. Did have pain to the same tooth in the past but does not have a dentist. No other complaints.  Past Medical History  Diagnosis Date  . Asthma   . GERD (gastroesophageal reflux disease)   . Sinusitis    Past Surgical History  Procedure Laterality Date  . Nasal sinus surgery    . Tonsillectomy    . Adenoidectomy    . Mouth surgery     No family history on file. History  Substance Use Topics  . Smoking status: Never Smoker   . Smokeless tobacco: Not on file  . Alcohol Use: No   OB History   Grav Para Term Preterm Abortions TAB SAB Ect Mult Living                 Review of Systems  Constitutional: Negative for fever.  HENT: Positive for dental problem.   Skin: Negative for rash.    Allergies  Review of patient's allergies indicates no known allergies.  Home Medications   Current Outpatient Rx  Name  Route  Sig  Dispense  Refill  . albuterol (PROVENTIL HFA;VENTOLIN HFA) 108 (90 BASE) MCG/ACT inhaler   Inhalation   Inhale into the lungs every 6 (six) hours as needed.         . DiphenhydrAMINE HCl (BENADRYL ALLERGY PO)   Oral   Take 2 tablets by mouth daily as needed. Patient used this medication for the swelling in her arm.         . fluticasone  (FLOVENT HFA) 44 MCG/ACT inhaler   Inhalation   Inhale 2 puffs into the lungs 2 (two) times daily.   1 Inhaler   12   . omeprazole (PRILOSEC) 20 MG capsule   Oral   Take 20 mg by mouth daily as needed.         . ranitidine (ZANTAC) 75 MG tablet   Oral   Take 75 mg by mouth once as needed.           BP 139/82  Pulse 112  Temp(Src) 99.1 F (37.3 C) (Oral)  Resp 16  Ht 5\' 8"  (1.727 m)  Wt 210 lb (95.255 kg)  BMI 31.94 kg/m2  SpO2 99%  LMP 11/17/2012 Physical Exam  Nursing note and vitals reviewed. Constitutional: She appears well-developed and well-nourished.  Patient appears uncomfortable, holding her R cheek.  HENT:  Mouth/Throat: Oropharynx is clear and moist.    Eyes: Conjunctivae are normal.  Neck: Neck supple.  Lymphadenopathy:    She has no cervical adenopathy.  Neurological: She is alert.  Skin: No rash noted.    ED Course  Procedures (including critical care time)  5:46 PM Dental pain after chipped a tooth.  Will d/c with abx, pain meds and dental referral.    Labs Review Labs  Reviewed - No data to display Imaging Review No results found.  EKG Interpretation   None       MDM   1. Pain, dental    BP 139/82  Pulse 112  Temp(Src) 99.1 F (37.3 C) (Oral)  Resp 16  Ht 5\' 8"  (1.727 m)  Wt 210 lb (95.255 kg)  BMI 31.94 kg/m2  SpO2 99%  LMP 11/17/2012  I have reviewed nursing notes and vital signs. I reviewed available ER/hospitalization records thought the EMR     Fayrene Helper, [place] 11/21/12 1748

## 2012-11-21 NOTE — Discharge Instructions (Signed)

## 2012-11-21 NOTE — ED Provider Notes (Signed)
Medical screening examination/treatment/procedure(s) were performed by non-physician practitioner and as supervising physician I was immediately available for consultation/collaboration.  EKG Interpretation   None        Ethelda Chick, MD 11/21/12 1757

## 2012-11-21 NOTE — ED Notes (Signed)
Pain from broken tooth on right upper x3 days.

## 2014-01-16 ENCOUNTER — Encounter (HOSPITAL_BASED_OUTPATIENT_CLINIC_OR_DEPARTMENT_OTHER): Payer: Self-pay

## 2014-01-16 ENCOUNTER — Emergency Department (HOSPITAL_BASED_OUTPATIENT_CLINIC_OR_DEPARTMENT_OTHER): Payer: Self-pay

## 2014-01-16 ENCOUNTER — Emergency Department (HOSPITAL_BASED_OUTPATIENT_CLINIC_OR_DEPARTMENT_OTHER)
Admission: EM | Admit: 2014-01-16 | Discharge: 2014-01-16 | Disposition: A | Discharge status: Home / Self Care | Payer: Self-pay | Attending: Emergency Medicine | Admitting: Emergency Medicine

## 2014-01-16 DIAGNOSIS — Z79899 Other long term (current) drug therapy: Secondary | ICD-10-CM | POA: Insufficient documentation

## 2014-01-16 DIAGNOSIS — Z7952 Long term (current) use of systemic steroids: Secondary | ICD-10-CM | POA: Insufficient documentation

## 2014-01-16 DIAGNOSIS — O99611 Diseases of the digestive system complicating pregnancy, first trimester: Secondary | ICD-10-CM | POA: Insufficient documentation

## 2014-01-16 DIAGNOSIS — K219 Gastro-esophageal reflux disease without esophagitis: Secondary | ICD-10-CM | POA: Insufficient documentation

## 2014-01-16 DIAGNOSIS — R102 Pelvic and perineal pain: Secondary | ICD-10-CM

## 2014-01-16 DIAGNOSIS — Z792 Long term (current) use of antibiotics: Secondary | ICD-10-CM | POA: Insufficient documentation

## 2014-01-16 DIAGNOSIS — R000 Tachycardia, unspecified: Secondary | ICD-10-CM | POA: Insufficient documentation

## 2014-01-16 DIAGNOSIS — O9989 Other specified diseases and conditions complicating pregnancy, childbirth and the puerperium: Secondary | ICD-10-CM | POA: Insufficient documentation

## 2014-01-16 DIAGNOSIS — O9952 Diseases of the respiratory system complicating childbirth: Secondary | ICD-10-CM | POA: Insufficient documentation

## 2014-01-16 DIAGNOSIS — J45909 Unspecified asthma, uncomplicated: Secondary | ICD-10-CM | POA: Insufficient documentation

## 2014-01-16 DIAGNOSIS — O26899 Other specified pregnancy related conditions, unspecified trimester: Secondary | ICD-10-CM

## 2014-01-16 DIAGNOSIS — R109 Unspecified abdominal pain: Secondary | ICD-10-CM

## 2014-01-16 DIAGNOSIS — Z3A13 13 weeks gestation of pregnancy: Secondary | ICD-10-CM | POA: Insufficient documentation

## 2014-01-16 LAB — URINALYSIS, ROUTINE W REFLEX MICROSCOPIC
Bilirubin Urine: NEGATIVE
Glucose, UA: NEGATIVE mg/dL
Ketones, ur: NEGATIVE mg/dL
LEUKOCYTES UA: NEGATIVE
NITRITE: NEGATIVE
PROTEIN: NEGATIVE mg/dL
Specific Gravity, Urine: 1.019 (ref 1.005–1.030)
UROBILINOGEN UA: 0.2 mg/dL (ref 0.0–1.0)
pH: 5.5 (ref 5.0–8.0)

## 2014-01-16 LAB — COMPREHENSIVE METABOLIC PANEL
ALK PHOS: 81 U/L (ref 39–117)
ALT: 12 U/L (ref 0–35)
ANION GAP: 7 (ref 5–15)
AST: 18 U/L (ref 0–37)
Albumin: 4.2 g/dL (ref 3.5–5.2)
BUN: 5 mg/dL — ABNORMAL LOW (ref 6–23)
CHLORIDE: 108 meq/L (ref 96–112)
CO2: 24 mmol/L (ref 19–32)
CREATININE: 0.64 mg/dL (ref 0.50–1.10)
Calcium: 9.2 mg/dL (ref 8.4–10.5)
GFR calc non Af Amer: >90 mL/min (ref >90)
GLUCOSE: 103 mg/dL — AB (ref 70–99)
POTASSIUM: 3.7 mmol/L (ref 3.5–5.1)
Sodium: 139 mmol/L (ref 135–145)
Total Bilirubin: 0.4 mg/dL (ref 0.3–1.2)
Total Protein: 7.4 g/dL (ref 6.0–8.3)

## 2014-01-16 LAB — ABO/RH: ABO/RH(D): O POS

## 2014-01-16 LAB — CBC WITH DIFFERENTIAL/PLATELET
Basophils Absolute: 0 10*3/uL (ref 0.0–0.1)
Basophils Relative: 0 % (ref 0–1)
EOS ABS: 0.2 10*3/uL (ref 0.0–0.7)
Eosinophils Relative: 2 % (ref 0–5)
HEMATOCRIT: 38.3 % (ref 36.0–46.0)
HEMOGLOBIN: 12.4 g/dL (ref 12.0–15.0)
LYMPHS ABS: 2.1 10*3/uL (ref 0.7–4.0)
Lymphocytes Relative: 25 % (ref 12–46)
MCH: 28.7 pg (ref 26.0–34.0)
MCHC: 32.4 g/dL (ref 30.0–36.0)
MCV: 88.7 fL (ref 78.0–100.0)
MONO ABS: 0.4 10*3/uL (ref 0.1–1.0)
MONOS PCT: 5 % (ref 3–12)
NEUTROS PCT: 68 % (ref 43–77)
Neutro Abs: 5.6 10*3/uL (ref 1.7–7.7)
Platelets: 300 10*3/uL (ref 150–400)
RBC: 4.32 MIL/uL (ref 3.87–5.11)
RDW: 13.7 % (ref 11.5–15.5)
WBC: 8.3 10*3/uL (ref 4.0–10.5)

## 2014-01-16 LAB — URINE MICROSCOPIC-ADD ON

## 2014-01-16 LAB — PREGNANCY, URINE: PREG TEST UR: POSITIVE — AB

## 2014-01-16 LAB — HCG, QUANTITATIVE, PREGNANCY: HCG, BETA CHAIN, QUANT, S: 67 m[IU]/mL — AB (ref <5)

## 2014-01-16 LAB — WET PREP, GENITAL
Trich, Wet Prep: NONE SEEN
Yeast Wet Prep HPF POC: NONE SEEN

## 2014-01-16 MED ORDER — SODIUM CHLORIDE 0.9 % IV BOLUS (SEPSIS)
1000.0000 mL | Freq: Once | INTRAVENOUS | Status: AC
  Administered 2014-01-16: 1000 mL via INTRAVENOUS

## 2014-01-16 MED ORDER — METRONIDAZOLE 500 MG PO TABS: 500.0000 mg | ORAL_TABLET | Freq: Two times a day (BID) | ORAL | Status: DC

## 2014-01-16 MED ORDER — ONDANSETRON HCL 4 MG/2ML IJ SOLN
4.0000 mg | Freq: Once | INTRAMUSCULAR | Status: AC
  Administered 2014-01-16: 4 mg via INTRAVENOUS
  Filled 2014-01-16: qty 2

## 2014-01-16 MED ORDER — CEPHALEXIN 500 MG PO CAPS: 500.0000 mg | ORAL_CAPSULE | Freq: Four times a day (QID) | ORAL | Status: DC

## 2014-01-16 MED ORDER — PRENATAL COMPLETE 14-0.4 MG PO TABS: 1.0000 | ORAL_TABLET | Freq: Every day | ORAL | Status: DC

## 2014-01-16 MED ORDER — ACETAMINOPHEN 325 MG PO TABS
650.0000 mg | ORAL_TABLET | Freq: Once | ORAL | Status: AC
  Administered 2014-01-16: 650 mg via ORAL
  Filled 2014-01-16: qty 2

## 2014-01-16 NOTE — ED Provider Notes (Signed)
CSN: [id number]     Arrival date & time 01/16/14  0825 History  This chart was scribed for Gina Octave, MD by Leone Payor, ED Scribe. This patient was seen in room MH07/MH07 and the patient's care was started 8:51 AM.    Chief Complaint  Patient presents with  . Vaginal Bleeding  . Abdominal Pain   The history is provided by the patient. No language interpreter was used.     HPI Comments: Gina Lynn is a 23 y.o. female who presents to the Emergency Department complaining of constant, unchanged vaginal bleeding for the past 1 week. Patient also reports 4 days of sharp, LLQ pain after lifting a 37 year old child. She reports having 2 positive pregnancy tests at home but states this was not confirmed during her health department visit yesterday. Per patient, she had a pelvic exam and STD screening but denies having a pregnancy test. Her LMP was 12/28/13. She states her current bleeding is less than normal menstrual bleeding and has been using about 1 pad per day. She denies chest pain, SOB, dizziness, vomiting, diarrhea. She denies history of abdominal surgeries.    Past Medical History  Diagnosis Date  . Asthma   . GERD (gastroesophageal reflux disease)   . Sinusitis    Past Surgical History  Procedure Laterality Date  . Nasal sinus surgery    . Tonsillectomy    . Adenoidectomy    . Mouth surgery     No family history on file. History  Substance Use Topics  . Smoking status: Never Smoker   . Smokeless tobacco: Not on file  . Alcohol Use: No   OB History    No data available     Review of Systems   A complete 10 system review of systems was obtained and all systems are negative except as noted in the HPI and PMH.    Allergies  Review of patient's allergies indicates no known allergies.  Home Medications   Prior to Admission medications   Medication Sig Start Date End Date Taking? Authorizing Provider  albuterol (PROVENTIL HFA;VENTOLIN HFA) 108 (90 BASE) MCG/ACT  inhaler Inhale into the lungs every 6 (six) hours as needed.    Historical Provider, MD  cephALEXin (KEFLEX) 500 MG capsule Take 1 capsule (500 mg total) by mouth 4 (four) times daily. 01/16/14   Gina Octave, MD  DiphenhydrAMINE HCl (BENADRYL ALLERGY PO) Take 2 tablets by mouth daily as needed. Patient used this medication for the swelling in her arm.    Historical Provider, MD  fluticasone (FLOVENT HFA) 44 MCG/ACT inhaler Inhale 2 puffs into the lungs 2 (two) times daily. 07/28/12   Richarda Overlie, MD  HYDROcodone-acetaminophen (NORCO/VICODIN) 5-325 MG per tablet Take 2 tablets by mouth every 4 (four) hours as needed for pain. 11/21/12   Fayrene Helper, PA-C  metroNIDAZOLE (FLAGYL) 500 MG tablet Take 1 tablet (500 mg total) by mouth 2 (two) times daily. 01/16/14   Gina Octave, MD  omeprazole (PRILOSEC) 20 MG capsule Take 20 mg by mouth daily as needed. 12/20/11   April K Palumbo-Rasch, MD  penicillin v potassium (VEETID) 500 MG tablet Take 1 tablet (500 mg total) by mouth 3 (three) times daily. 11/21/12   Fayrene Helper, PA-C  Prenatal Vit-Fe Fumarate-FA (PRENATAL COMPLETE) 14-0.4 MG TABS Take 1 tablet by mouth daily. 01/16/14   Gina Octave, MD  ranitidine (ZANTAC) 75 MG tablet Take 75 mg by mouth once as needed.     Historical Provider, MD  BP 125/58 mmHg  Pulse 72  Temp(Src) 98 F (36.7 C) (Oral)  Resp 16  Ht 5\' 8"  (1.727 m)  Wt 150 lb (68.04 kg)  BMI 22.81 kg/m2  SpO2 100%  LMP 12/28/2013 Physical Exam  Constitutional: She is oriented to person, place, and time. She appears well-developed and well-nourished. No distress.  Uncomfortable appearing.   HENT:  Head: Normocephalic and atraumatic.  Mouth/Throat: Oropharynx is clear and moist. No oropharyngeal exudate.  Eyes: Conjunctivae and EOM are normal. Pupils are equal, round, and reactive to light.  Neck: Normal range of motion. Neck supple.  No meningismus.  Cardiovascular: Regular rhythm, normal heart sounds and intact distal  pulses.  Tachycardia present.   No murmur heard. Pulmonary/Chest: Effort normal and breath sounds normal. No respiratory distress.  Abdominal: Soft. There is tenderness. There is no rebound and no guarding.  LLQ tenderness, no guarding    Genitourinary:  Normal external genitalia. Dark blood in vaginal vault. Cervix is closed. No CMT. Left adnexal tenderness    Musculoskeletal: Normal range of motion. She exhibits tenderness. She exhibits no edema.  Left paraspinal lumbar tenderness   Neurological: She is alert and oriented to person, place, and time. No cranial nerve deficit. She exhibits normal muscle tone. Coordination normal.  No ataxia on finger to nose bilaterally. No pronator drift. 5/5 strength throughout. CN 2-12 intact. Negative Romberg. Equal grip strength. Sensation intact. Gait is normal.   Skin: Skin is warm.  Psychiatric: She has a normal mood and affect. Her behavior is normal.  Nursing note and vitals reviewed.   ED Course  Procedures (including critical care time)  DIAGNOSTIC STUDIES: Oxygen Saturation is 100% on RA, normal by my interpretation.    COORDINATION OF CARE: 9:04 AM Will order US and lab work. Discussed treatment plan with pt at bedside and pt agreed to plan.  11:04 AM Updated patient on imaging and lab results. Advised to follow up with Ob-Gyn for follow up US.    12:09 PM Discussed with patient the need to [facility] in [facility] for labs. Will prescribe prenatal vitamins and advised to take Tylenol for pain.    Labs Review Labs Reviewed  WET PREP, GENITAL - Abnormal; Notable for the following:    Clue Cells Wet Prep HPF POC FEW (*)    WBC, Wet Prep HPF POC FEW (*)    All other components within normal limits  URINALYSIS, ROUTINE W REFLEX MICROSCOPIC - Abnormal; Notable for the following:    APPearance CLOUDY (*)    Hgb urine dipstick LARGE (*)    All other components within normal limits  PREGNANCY, URINE - Abnormal; Notable  for the following:    Preg Test, Ur POSITIVE (*)    All other components within normal limits  COMPREHENSIVE METABOLIC PANEL - Abnormal; Notable for the following:    Glucose, Bld 103 (*)    BUN 5 (*)    All other components within normal limits  HCG, QUANTITATIVE, PREGNANCY - Abnormal; Notable for the following:    hCG, Beta Chain, Quant, S 67 (*)    All other components within normal limits  URINE MICROSCOPIC-ADD ON - Abnormal; Notable for the following:    Squamous Epithelial / LPF FEW (*)    Bacteria, UA MANY (*)    All other components within normal limits  GC/CHLAMYDIA PROBE AMP  URINE CULTURE  CBC WITH DIFFERENTIAL  ABO/RH    Imaging Review [place] Ob Comp Less 14 Wks  01/16/2014   CLINICAL DATA:  Vaginal bleeding and pain  EXAM: OBSTETRIC <14 WK US AND TRANSVAGINAL OB US  TECHNIQUE: Both transabdominal and transvaginal ultrasound examinations were performed for complete evaluation of the gestation as well as the maternal uterus, adnexal regions, and pelvic cul-de-sac. Transvaginal technique was performed to assess early pregnancy.  COMPARISON:  None.  FINDINGS: There is no intrauterine gestation currently. The uterus measures 5.3 x 3.8 by 5.7 cm. There is a hypoechoic mass arising from the right posterior aspect of the uterus fundus measuring 0.9 x 1.1 x 0.8 cm which probably represents a small eccentric leiomyoma. No other uterine mass. The endometrium measures 7 mm in thickness with a smooth contour. There is a small amount of fluid in the cervical os.  The right ovary measures 2.9 x 1.5 x 2.3 cm. Left ovary measures 2.1 x 2.1 x 1.6 cm. There is a dominant follicle arising from left ovary measuring 1.3 x 1.0 cm. No extrauterine pelvic or adnexal masses are identified beyond this dominant follicle arising from left ovary. There is no appreciable free pelvic fluid.  IMPRESSION: No intrauterine gestation. 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 Small amount of fluid in cervical os.   Given these findings, differential considerations include intrauterine gestation too early to be seen by either transabdominal or transvaginal technique; recent spontaneous abortion, or possibly ectopic gestation. These findings warrant close clinical surveillance as well as beta HCG follow-up. Repeat timing of ultrasound will in large part depend on beta HCG findings.   Electronically Signed   By: Bretta BangWilliam  Woodruff M.D.   On: 01/16/2014 09:51   [place] Ob Transvaginal  01/16/2014   CLINICAL DATA:  Vaginal bleeding and pain  EXAM: OBSTETRIC <14 WK US AND TRANSVAGINAL OB US  TECHNIQUE: Both transabdominal and transvaginal ultrasound examinations were performed for complete evaluation of the gestation as well as the maternal uterus, adnexal regions, and pelvic cul-de-sac. Transvaginal technique was performed to assess early pregnancy.  COMPARISON:  None.  FINDINGS: There is no intrauterine gestation currently. The uterus measures 5.3 x 3.8 by 5.7 cm. There is a hypoechoic mass arising from the right posterior aspect of the uterus fundus measuring 0.9 x 1.1 x 0.8 cm which probably represents a small eccentric leiomyoma. No other uterine mass. The endometrium measures 7 mm in thickness with a smooth contour. There is a small amount of fluid in the cervical os.  The right ovary measures 2.9 x 1.5 x 2.3 cm. Left ovary measures 2.1 x 2.1 x 1.6 cm. There is a dominant follicle arising from left ovary measuring 1.3 x 1.0 cm. No extrauterine pelvic or adnexal masses are identified beyond this dominant follicle arising from left ovary. There is no appreciable free pelvic fluid.  IMPRESSION: No intrauterine gestation. 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 Small amount of fluid in cervical os.  Given these findings, differential considerations include intrauterine gestation too early to be seen by either transabdominal or transvaginal technique; recent spontaneous abortion, or possibly ectopic gestation. These findings  warrant close clinical surveillance as well as beta HCG follow-up. Repeat timing of ultrasound will in large part depend on beta HCG findings.   Electronically Signed   By: Bretta BangWilliam  Woodruff M.D.   On: 01/16/2014 09:51   [place] Renal  01/16/2014   CLINICAL DATA:  Left lower quadrant/left flank pain.  EXAM: RENAL/URINARY TRACT ULTRASOUND COMPLETE  COMPARISON:  Pelvic ultrasound earlier the same day.  FINDINGS: Right Kidney:  Length: 11.9 cm. Echogenicity within normal  limits. No mass or hydronephrosis visualized.  Left Kidney:  Length: 11.3 cm. Echogenicity within normal limits. No mass or hydronephrosis visualized.  Bladder:  Decompressed and not well evaluated.  IMPRESSION: Normal sonographic appearance of the kidneys. The bladder is decompressed and not well evaluated.   Electronically Signed   By: Rubye Oaks M.D.   On: 01/16/2014 13:42     EKG Interpretation None      MDM   Final diagnoses:  Pelvic pain during pregnancy  Flank pain   3 days of left lower quadrant abdominal pain. Vaginal bleeding onset 5 days ago. Positive pregnancy test at home. Last menstrual cycle December 3.  Patient tender in the left adnexa she is tachycardic. Rule out ectopic. Beta Quant is only 67.  No IUP seen on ultrasound. Consider intrauterine gestation too early to be seen versus recent abortion versus ectopic. No hydronephrosis or ureteral obstruction seen. Heart rate has improved to 87.  Results discussed with patient. She needs repeat hCG in 48 hours to assess for appropriate rise. Discussed with Clydie Braun, PA at [facility]. She'll need a repeat ultrasound in 2 weeks.  D/w Dr. Adrian Blackwater,. O+ not rhogam candidate. Urine culture will be sent. Patient with asymptomatic bacteriuria and will treat in setting of pregnancy. Treat possible BV as well.  Patient understands to follow up with [facility] in 48 hours for repeat HCG or sooner with worsening pain, bleeding or other concerns.  BP 125/58 mmHg  Pulse 72   Temp(Src) 98 F (36.7 C) (Oral)  Resp 16  Ht 5\' 8"  (1.727 m)  Wt 150 lb (68.04 kg)  BMI 22.81 kg/m2  SpO2 100%  LMP 12/28/2013    I personally performed the services described in this documentation, which was scribed in my presence. The recorded information has been reviewed and is accurate.   Gina Octave, MD 01/16/14 [phone]

## 2014-01-16 NOTE — ED Notes (Signed)
Patient transported to Ultrasound 

## 2014-01-16 NOTE — ED Notes (Signed)
MD at bedside. 

## 2014-01-16 NOTE — Discharge Instructions (Signed)
Pelvic Pain Your pregnancy is not yet seen an ultrasound. It is NOT confirmed that your pregnancy is currently in the right place. You need to have a repeat blood test in [facility]. You need a repeat ultrasound in 2 weeks. Return to [facility] sooner with worsening pain, vaginal bleeding or any other concerns. Female pelvic pain can be caused by many different things and start from a variety of places. Pelvic pain refers to pain that is located in the lower half of the abdomen and between your hips. The pain may occur over a short period of time (acute) or may be reoccurring (chronic). The cause of pelvic pain may be related to disorders affecting the female reproductive organs (gynecologic), but it may also be related to the bladder, kidney stones, an intestinal complication, or muscle or skeletal problems. Getting help right away for pelvic pain is important, especially if there has been severe, sharp, or a sudden onset of unusual pain. It is also important to get help right away because some types of pelvic pain can be life threatening.  CAUSES  Below are only some of the causes of pelvic pain. The causes of pelvic pain can be in one of several categories.   Gynecologic.  Pelvic inflammatory disease.  Sexually transmitted infection.  Ovarian cyst or a twisted ovarian ligament (ovarian torsion).  Uterine lining that grows outside the uterus (endometriosis).  Fibroids, cysts, or tumors.  Ovulation.  Pregnancy.  Pregnancy that occurs outside the uterus (ectopic pregnancy).  Miscarriage.  Labor.  Abruption of the placenta or ruptured uterus.  Infection.  Uterine infection (endometritis).  Bladder infection.  Diverticulitis.  Miscarriage related to a uterine infection (septic abortion).  Bladder.  Inflammation of the bladder (cystitis).  Kidney stone(s).  Gastrointestinal.  Constipation.  Diverticulitis.  Neurologic.  Trauma.  Feeling  pelvic pain because of mental or emotional causes (psychosomatic).  Cancers of the bowel or pelvis. EVALUATION  Your caregiver will want to take a careful history of your concerns. This includes recent changes in your health, a careful gynecologic history of your periods (menses), and a sexual history. Obtaining your family history and medical history is also important. Your caregiver may suggest a pelvic exam. A pelvic exam will help identify the location and severity of the pain. It also helps in the evaluation of which organ system may be involved. In order to identify the cause of the pelvic pain and be properly treated, your caregiver may order tests. These tests may include:   A pregnancy test.  Pelvic ultrasonography.  An X-ray exam of the abdomen.  A urinalysis or evaluation of vaginal discharge.  Blood tests. HOME CARE INSTRUCTIONS   Only take over-the-counter or prescription medicines for pain, discomfort, or fever as directed by your caregiver.   Rest as directed by your caregiver.   Eat a balanced diet.   Drink enough fluids to make your urine clear or pale yellow, or as directed.   Avoid sexual intercourse if it causes pain.   Apply warm or cold compresses to the lower abdomen depending on which one helps the pain.   Avoid stressful situations.   Keep a journal of your pelvic pain. Write down when it started, where the pain is located, and if there are things that seem to be associated with the pain, such as food or your menstrual cycle.  Follow up with your caregiver as directed.  SEEK MEDICAL CARE IF:  Your medicine does not help your  pain.  You have abnormal vaginal discharge. SEEK IMMEDIATE MEDICAL CARE IF:   You have heavy bleeding from the vagina.   Your pelvic pain increases.   You feel light-headed or faint.   You have chills.   You have pain with urination or blood in your urine.   You have uncontrolled diarrhea or vomiting.    You have a fever or persistent symptoms for more than 3 days.  You have a fever and your symptoms suddenly get worse.   You are being physically or sexually abused.  MAKE SURE YOU:  Understand these instructions.  Will watch your condition.  Will get help if you are not doing well or get worse. Document Released: 12/10/2003 Document Revised: 05/29/2013 Document Reviewed: 05/04/2011 [facility] Patient Information 2015 [place], [place]. This information is not intended to replace advice given to you by your health care provider. Make sure you discuss any questions you have with your health care provider.

## 2014-01-16 NOTE — ED Notes (Signed)
O positive blood type per blood bank [facility].

## 2014-01-16 NOTE — ED Notes (Signed)
Reports vaginal bleeding x 1 week. Sts 2 + preg tests at home. Left lower abdominal pain

## 2014-01-17 ENCOUNTER — Other Ambulatory Visit: Payer: Self-pay

## 2014-01-17 DIAGNOSIS — Z3200 Encounter for pregnancy test, result unknown: Secondary | ICD-10-CM

## 2014-01-17 LAB — URINE CULTURE

## 2014-01-17 LAB — GC/CHLAMYDIA PROBE AMP
CT Probe RNA: NEGATIVE
GC Probe RNA: NEGATIVE

## 2014-01-17 NOTE — Progress Notes (Signed)
Pt called the front desk and stated that she was in [facility] and was informed to our office to get a rpt beta quant.  Antoinette informed pt that our office will be closed for the holidays.  I advised pt that she can come to MAU for the lab draw.  Pt agreed.  I called Lupita LeashDonna, charge nurse, in MAU and informed her that pt will be coming in on Christmas day for a rpt beta quant to follow up her beta that she had [facility] to confirm pregnancy. I informed Lupita LeashDonna that the lab order will be placed for lab to draw.  Lupita LeashDonna agreed.

## 2014-01-18 ENCOUNTER — Inpatient Hospital Stay (HOSPITAL_COMMUNITY)
Admission: AD | Admit: 2014-01-18 | Discharge: 2014-01-18 | Disposition: A | Discharge status: Home / Self Care | Payer: Self-pay | Source: Ambulatory Visit | Attending: Obstetrics & Gynecology | Admitting: Obstetrics & Gynecology

## 2014-01-18 ENCOUNTER — Encounter (HOSPITAL_COMMUNITY): Payer: Self-pay | Admitting: *Deleted

## 2014-01-18 DIAGNOSIS — O0281 Inappropriate change in quantitative human chorionic gonadotropin (hCG) in early pregnancy: Secondary | ICD-10-CM | POA: Insufficient documentation

## 2014-01-18 LAB — HCG, QUANTITATIVE, PREGNANCY: hCG, Beta Chain, Quant, S: 54 m[IU]/mL — ABNORMAL HIGH (ref <5)

## 2014-01-18 NOTE — MAU Provider Note (Signed)
CSN: [id number]     Arrival date & time 01/18/14  1558 History   None    Chief Complaint  Patient presents with  . Follow-up     (Consider location/radiation/quality/duration/timing/severity/associated sxs/prior Treatment) HPI  Gina Lynn is a 23 y.o. G1P0 @ [redacted]w[redacted]d gestation who presents to the MAU for follow up Bhcg. She was in the ED 2 days ago with bleeding in early pregnancy and had a low Bhcg and advised to f/u here today to repeat the number.   Past Medical History  Diagnosis Date  . Asthma   . GERD (gastroesophageal reflux disease)   . Sinusitis    Past Surgical History  Procedure Laterality Date  . Nasal sinus surgery    . Tonsillectomy    . Adenoidectomy    . Mouth surgery     No family history on file. History  Substance Use Topics  . Smoking status: Never Smoker   . Smokeless tobacco: Not on file  . Alcohol Use: No   OB History    Gravida Para Term Preterm AB TAB SAB Ectopic Multiple Living   1              Review of Systems Negative except as stated in HPI   Allergies  Review of patient's allergies indicates no known allergies.  Home Medications   Prior to Admission medications   Medication Sig Start Date End Date Taking? Authorizing Provider  albuterol (PROVENTIL HFA;VENTOLIN HFA) 108 (90 BASE) MCG/ACT inhaler Inhale into the lungs every 6 (six) hours as needed.    Historical Provider, MD  cephALEXin (KEFLEX) 500 MG capsule Take 1 capsule (500 mg total) by mouth 4 (four) times daily. 01/16/14   Ezequiel Essex, MD  DiphenhydrAMINE HCl (BENADRYL ALLERGY PO) Take 2 tablets by mouth daily as needed. Patient used this medication for the swelling in her arm.    Historical Provider, MD  fluticasone (FLOVENT HFA) 44 MCG/ACT inhaler Inhale 2 puffs into the lungs 2 (two) times daily. 07/28/12   Reyne Dumas, MD  metroNIDAZOLE (FLAGYL) 500 MG tablet Take 1 tablet (500 mg total) by mouth 2 (two) times daily. 01/16/14   Ezequiel Essex, MD  omeprazole (PRILOSEC)  20 MG capsule Take 20 mg by mouth daily as needed. 12/20/11   April Alfonso Patten, MD  Prenatal Vit-Fe Fumarate-FA (PRENATAL COMPLETE) 14-0.4 MG TABS Take 1 tablet by mouth daily. 01/16/14   Ezequiel Essex, MD  ranitidine (ZANTAC) 75 MG tablet Take 75 mg by mouth once as needed.     Historical Provider, MD   BP 148/88 mmHg  Pulse 106  Temp(Src) 98.5 F (36.9 C) (Oral)  Resp 20  Ht $R'5\' 7"'Wa$  (1.702 m)  Wt 202 lb (91.627 kg)  BMI 31.63 kg/m2  LMP 12/28/2013 Physical Exam  ED Course  Procedures (including critical care time) Recent Results (from the past 2160 hour(s))  Urinalysis, Routine w reflex microscopic     Status: Abnormal   Collection Time: 01/16/14  8:34 AM  Result Value Ref Range   Color, Urine YELLOW YELLOW   APPearance CLOUDY (A) CLEAR   Specific Gravity, Urine 1.019 1.005 - 1.030   pH 5.5 5.0 - 8.0   Glucose, UA NEGATIVE NEGATIVE mg/dL   Hgb urine dipstick LARGE (A) NEGATIVE   Bilirubin Urine NEGATIVE NEGATIVE   Ketones, ur NEGATIVE NEGATIVE mg/dL   Protein, ur NEGATIVE NEGATIVE mg/dL   Urobilinogen, UA 0.2 0.0 - 1.0 mg/dL   Nitrite NEGATIVE NEGATIVE   Leukocytes, UA NEGATIVE NEGATIVE  Pregnancy, urine     Status: Abnormal   Collection Time: 01/16/14  8:34 AM  Result Value Ref Range   Preg Test, Ur POSITIVE (A) NEGATIVE    Comment:        THE SENSITIVITY OF THIS METHODOLOGY IS >20 mIU/mL.   Urine microscopic-add on     Status: Abnormal   Collection Time: 01/16/14  8:34 AM  Result Value Ref Range   Squamous Epithelial / LPF FEW (A) RARE   WBC, UA 0-2 <3 WBC/hpf   RBC / HPF 21-50 <3 RBC/hpf   Bacteria, UA MANY (A) RARE  Urine culture     Status: None   Collection Time: 01/16/14  8:35 AM  Result Value Ref Range   Specimen Description URINE, CLEAN CATCH    Special Requests NONE    Culture  Setup Time      01/16/2014 21:28 Performed at [facility]      70,000 COLONIES/ML Performed at [facility]    Culture       Multiple bacterial morphotypes present, none predominant. Suggest appropriate recollection if clinically indicated. Performed at [facility]    Report Status 01/17/2014 FINAL   CBC with Differential     Status: None   Collection Time: 01/16/14  8:50 AM  Result Value Ref Range   WBC 8.3 4.0 - 10.5 K/uL   RBC 4.32 3.87 - 5.11 MIL/uL   Hemoglobin 12.4 12.0 - 15.0 g/dL   HCT 38.3 36.0 - 46.0 %   MCV 88.7 78.0 - 100.0 fL   MCH 28.7 26.0 - 34.0 pg   MCHC 32.4 30.0 - 36.0 g/dL   RDW 13.7 11.5 - 15.5 %   Platelets 300 150 - 400 K/uL   Neutrophils Relative % 68 43 - 77 %   Neutro Abs 5.6 1.7 - 7.7 K/uL   Lymphocytes Relative 25 12 - 46 %   Lymphs Abs 2.1 0.7 - 4.0 K/uL   Monocytes Relative 5 3 - 12 %   Monocytes Absolute 0.4 0.1 - 1.0 K/uL   Eosinophils Relative 2 0 - 5 %   Eosinophils Absolute 0.2 0.0 - 0.7 K/uL   Basophils Relative 0 0 - 1 %   Basophils Absolute 0.0 0.0 - 0.1 K/uL  Comprehensive metabolic panel     Status: Abnormal   Collection Time: 01/16/14  8:50 AM  Result Value Ref Range   Sodium 139 135 - 145 mmol/L    Comment: Please note change in reference range.   Potassium 3.7 3.5 - 5.1 mmol/L    Comment: Please note change in reference range.   Chloride 108 96 - 112 mEq/L   CO2 24 19 - 32 mmol/L   Glucose, Bld 103 (H) 70 - 99 mg/dL   BUN 5 (L) 6 - 23 mg/dL   Creatinine, Ser 0.64 0.50 - 1.10 mg/dL   Calcium 9.2 8.4 - 10.5 mg/dL   Total Protein 7.4 6.0 - 8.3 g/dL   Albumin 4.2 3.5 - 5.2 g/dL   AST 18 0 - 37 U/L   ALT 12 0 - 35 U/L   Alkaline Phosphatase 81 39 - 117 U/L   Total Bilirubin 0.4 0.3 - 1.2 mg/dL   GFR calc non Af Amer >90 >90 mL/min   GFR calc Af Amer >90 >90 mL/min    Comment: (NOTE) The eGFR has been calculated using the CKD EPI equation. This calculation has not been validated in all clinical  situations. eGFR's persistently <90 mL/min signify possible Chronic Kidney Disease.    Anion gap 7 5 - 15  hCG, quantitative, pregnancy     Status:  Abnormal   Collection Time: 01/16/14  8:50 AM  Result Value Ref Range   hCG, Beta Chain, Quant, S 67 (H) <5 mIU/mL    Comment:          GEST. AGE      CONC.  (mIU/mL)   <=1 WEEK        5 - 50     2 WEEKS       50 - 500     3 WEEKS       100 - 10,000     4 WEEKS     1,000 - 30,000     5 WEEKS     3,500 - 115,000   6-8 WEEKS     12,000 - 270,000    12 WEEKS     15,000 - 220,000        FEMALE AND NON-PREGNANT FEMALE:     LESS THAN 5 mIU/mL   ABO/Rh     Status: None   Collection Time: 01/16/14  8:50 AM  Result Value Ref Range   ABO/RH(D) O POS    No rh immune globuloin      NOT A RH IMMUNE GLOBULIN CANDIDATE, PT RH POSITIVE Performed at [facility]   Wet prep, genital     Status: Abnormal   Collection Time: 01/16/14  8:57 AM  Result Value Ref Range   Yeast Wet Prep HPF POC NONE SEEN NONE SEEN   Trich, Wet Prep NONE SEEN NONE SEEN   Clue Cells Wet Prep HPF POC FEW (A) NONE SEEN   WBC, Wet Prep HPF POC FEW (A) NONE SEEN  GC/Chlamydia Probe Amp     Status: None   Collection Time: 01/16/14  8:57 AM  Result Value Ref Range   CT Probe RNA NEGATIVE NEGATIVE   GC Probe RNA NEGATIVE NEGATIVE    Comment: (NOTE)                                                                                       **Normal Reference Range: Negative**      Assay performed using the Gen-Probe APTIMA COMBO2 (R) Assay. Acceptable specimen types for this assay include APTIMA Swabs (Unisex, endocervical, urethral, or vaginal), first void urine, and ThinPrep liquid based cytology samples. Performed at [facility]   hCG, quantitative, pregnancy     Status: Abnormal   Collection Time: 01/18/14  4:05 PM  Result Value Ref Range   hCG, Beta Chain, Quant, S 54 (H) <5 mIU/mL    Comment:          GEST. AGE      CONC.  (mIU/mL)   <=1 WEEK        5 - 50     2 WEEKS       50 - 500     3 WEEKS       100 - 10,000     4 WEEKS     1,000 - 30,000  5 WEEKS     3,500 - 115,000   6-8 WEEKS     12,000  - 270,000    12 WEEKS     15,000 - 220,000        FEMALE AND NON-PREGNANT FEMALE:     LESS THAN 5 mIU/mL     MDM  23 y.o. female with drop in Bhcg from 2 days ago. Discussed with the patient results and most likely failed pregnancy. Will have patient return in one week for repeat. Stable for discharge without hemorrhage or other problems. She will return for heavy bleeding or problems.  Final diagnoses:  Inappropriate change in quantitative hCG in early pregnancy

## 2014-01-18 NOTE — MAU Note (Addendum)
[facility] no longer does preg test if you have not been off cycle at least 30 days.  Pt had been off cycle a few days then started bleeding.  Did home tests, were positive. Went to Ed for confirmation.  No pain now. Less bleeding- almost stopped.

## 2014-01-25 ENCOUNTER — Inpatient Hospital Stay (HOSPITAL_COMMUNITY)
Admission: AD | Admit: 2014-01-25 | Discharge: 2014-01-25 | Disposition: A | Discharge status: Home / Self Care | Payer: Self-pay | Source: Ambulatory Visit | Attending: Obstetrics and Gynecology | Admitting: Obstetrics and Gynecology

## 2014-01-25 DIAGNOSIS — O039 Complete or unspecified spontaneous abortion without complication: Secondary | ICD-10-CM | POA: Insufficient documentation

## 2014-01-25 DIAGNOSIS — Z3A01 Less than 8 weeks gestation of pregnancy: Secondary | ICD-10-CM | POA: Insufficient documentation

## 2014-01-25 LAB — HCG, QUANTITATIVE, PREGNANCY: hCG, Beta Chain, Quant, S: 24 m[IU]/mL — ABNORMAL HIGH (ref <5)

## 2014-01-25 NOTE — MAU Note (Signed)
Here for follow up blood work, denies pain.  Small amt of reddish blood. Feeling ok.

## 2014-01-25 NOTE — Discharge Instructions (Signed)
Miscarriage A miscarriage is the sudden loss of an unborn baby (fetus) before the 20th week of pregnancy. Most miscarriages happen in the first 3 months of pregnancy. Sometimes, it happens before a woman even knows she is pregnant. A miscarriage is also called a "spontaneous miscarriage" or "early pregnancy loss." Having a miscarriage can be an emotional experience. Talk with your caregiver about any questions you may have about miscarrying, the grieving process, and your future pregnancy plans. CAUSES   Problems with the fetal chromosomes that make it impossible for the baby to develop normally. Problems with the baby's genes or chromosomes are most often the result of errors that occur, by chance, as the embryo divides and grows. The problems are not inherited from the parents.  Infection of the cervix or uterus.   Hormone problems.   Problems with the cervix, such as having an incompetent cervix. This is when the tissue in the cervix is not strong enough to hold the pregnancy.   Problems with the uterus, such as an abnormally shaped uterus, uterine fibroids, or congenital abnormalities.   Certain medical conditions.   Smoking, drinking alcohol, or taking illegal drugs.   Trauma.  Often, the cause of a miscarriage is unknown.  SYMPTOMS   Vaginal bleeding or spotting, with or without cramps or pain.  Pain or cramping in the abdomen or lower back.  Passing fluid, tissue, or blood clots from the vagina. DIAGNOSIS  Your caregiver will perform a physical exam. You may also have an ultrasound to confirm the miscarriage. Blood or urine tests may also be ordered. TREATMENT   Sometimes, treatment is not necessary if you naturally pass all the fetal tissue that was in the uterus. If some of the fetus or placenta remains in the body (incomplete miscarriage), tissue left behind may become infected and must be removed. Usually, a dilation and curettage (D and C) procedure is performed.  During a D and C procedure, the cervix is widened (dilated) and any remaining fetal or placental tissue is gently removed from the uterus.  Antibiotic medicines are prescribed if there is an infection. Other medicines may be given to reduce the size of the uterus (contract) if there is a lot of bleeding.  If you have Rh negative blood and your baby was Rh positive, you will need a Rh immunoglobulin shot. This shot will protect any future baby from having Rh blood problems in future pregnancies. HOME CARE INSTRUCTIONS   Your caregiver may order bed rest or may allow you to continue light activity. Resume activity as directed by your caregiver.  Have someone help with home and family responsibilities during this time.   Keep track of the number of sanitary pads you use each day and how soaked (saturated) they are. Write down this information.   Do not use tampons. Do not douche or have sexual intercourse until approved by your caregiver.   Only take over-the-counter or prescription medicines for pain or discomfort as directed by your caregiver.   Do not take aspirin. Aspirin can cause bleeding.   Keep all follow-up appointments with your caregiver.   If you or your partner have problems with grieving, talk to your caregiver or seek counseling to help cope with the pregnancy loss. Allow enough time to grieve before trying to get pregnant again.  SEEK IMMEDIATE MEDICAL CARE IF:   You have severe cramps or pain in your back or abdomen.  You have a fever.  You pass large blood clots (walnut-sized   or larger) ortissue from your vagina. Save any tissue for your caregiver to inspect.   Your bleeding increases.   You have a thick, bad-smelling vaginal discharge.  You become lightheaded, weak, or you faint.   You have chills.  MAKE SURE YOU:  Understand these instructions.  Will watch your condition.  Will get help right away if you are not doing well or get  worse. Document Released: 07/08/2000 Document Revised: 05/09/2012 Document Reviewed: 03/03/2011 [employer] Patient Information 2015 [employer]. This information is not intended to replace advice given to you by your health care provider. Make sure you discuss any questions you have with your health care provider.  

## 2014-01-25 NOTE — MAU Provider Note (Signed)
  History     CSN: [id number]  Arrival date and time: 01/25/14 1400   None     Chief Complaint  Patient presents with  . Follow-up   HPI  Gina Lynn is a 23 y.o. G1P0 at 5664w0d who presents today for FU HCG. She denies any pain today. She is having some spotting.   Past Medical History  Diagnosis Date  . Asthma   . GERD (gastroesophageal reflux disease)   . Sinusitis     Past Surgical History  Procedure Laterality Date  . Nasal sinus surgery    . Tonsillectomy    . Adenoidectomy    . Mouth surgery      No family history on file.  History  Substance Use Topics  . Smoking status: Never Smoker   . Smokeless tobacco: Not on file  . Alcohol Use: No    Allergies: No Known Allergies  Prescriptions prior to admission  Medication Sig Dispense Refill Last Dose  . albuterol (PROVENTIL HFA;VENTOLIN HFA) 108 (90 BASE) MCG/ACT inhaler Inhale into the lungs every 6 (six) hours as needed.   Taking  . cephALEXin (KEFLEX) 500 MG capsule Take 1 capsule (500 mg total) by mouth 4 (four) times daily. 40 capsule 0   . DiphenhydrAMINE HCl (BENADRYL ALLERGY PO) Take 2 tablets by mouth daily as needed. Patient used this medication for the swelling in her arm.   Taking  . fluticasone (FLOVENT HFA) 44 MCG/ACT inhaler Inhale 2 puffs into the lungs 2 (two) times daily. 1 Inhaler 12 Taking  . metroNIDAZOLE (FLAGYL) 500 MG tablet Take 1 tablet (500 mg total) by mouth 2 (two) times daily. 14 tablet 0   . omeprazole (PRILOSEC) 20 MG capsule Take 20 mg by mouth daily as needed.   Taking  . Prenatal Vit-Fe Fumarate-FA (PRENATAL COMPLETE) 14-0.4 MG TABS Take 1 tablet by mouth daily. 60 each 0   . ranitidine (ZANTAC) 75 MG tablet Take 75 mg by mouth once as needed.    Taking    ROS Physical Exam   Last menstrual period 12/28/2013.  Physical Exam  Nursing note and vitals reviewed. Constitutional: She is oriented to person, place, and time. She appears well-developed and well-nourished.   Cardiovascular: Normal rate.   Respiratory: Effort normal.  GI: Soft. There is no tenderness. There is no rebound.  Neurological: She is alert and oriented to person, place, and time.  Skin: Skin is warm and dry.  Psychiatric: She has a normal mood and affect.    MAU Course  Procedures  Results for Gina Lynn, Gina Lynn (MRN [id number]) as of 01/25/2014 15:42  Ref. Range 01/18/2014 16:05 01/25/2014 14:31  hCG, Beta Chain, Quant, S Latest Range: <5 mIU/mL 54 (H) 24 (H)    Assessment and Plan   1. SAB (spontaneous abortion)    Bleeding precautions Return to MAU as needed  Follow-up Information    [facility]   Specialty:  Obstetrics and Gynecology   Why:  02/01/14 between 8-11am for repeat bloodwork    Contact information:   [address] [place] [id number] [phone]       Tawnya CrookHogan, Zidane Renner Donovan 01/25/2014, 3:43 PM

## 2014-02-01 ENCOUNTER — Other Ambulatory Visit: Payer: Self-pay

## 2014-02-01 ENCOUNTER — Telehealth: Payer: Self-pay | Admitting: *Deleted

## 2014-02-05 ENCOUNTER — Other Ambulatory Visit: Payer: Self-pay

## 2014-02-05 ENCOUNTER — Telehealth: Payer: Self-pay | Admitting: Obstetrics & Gynecology

## 2014-02-05 NOTE — Telephone Encounter (Signed)
Left message to reschedule for her missed Lab appointment.

## 2014-06-28 NOTE — Telephone Encounter (Signed)
Opened in error

## 2014-06-29 IMAGING — CR DG CHEST 1V PORT
1 series · 1 of 1 positions shown · non-contrast
Comparison: No priors.

CLINICAL DATA: Cough and shortness of breath.  History of asthma.

PORTABLE CHEST - 1 VIEW

[view not recorded]
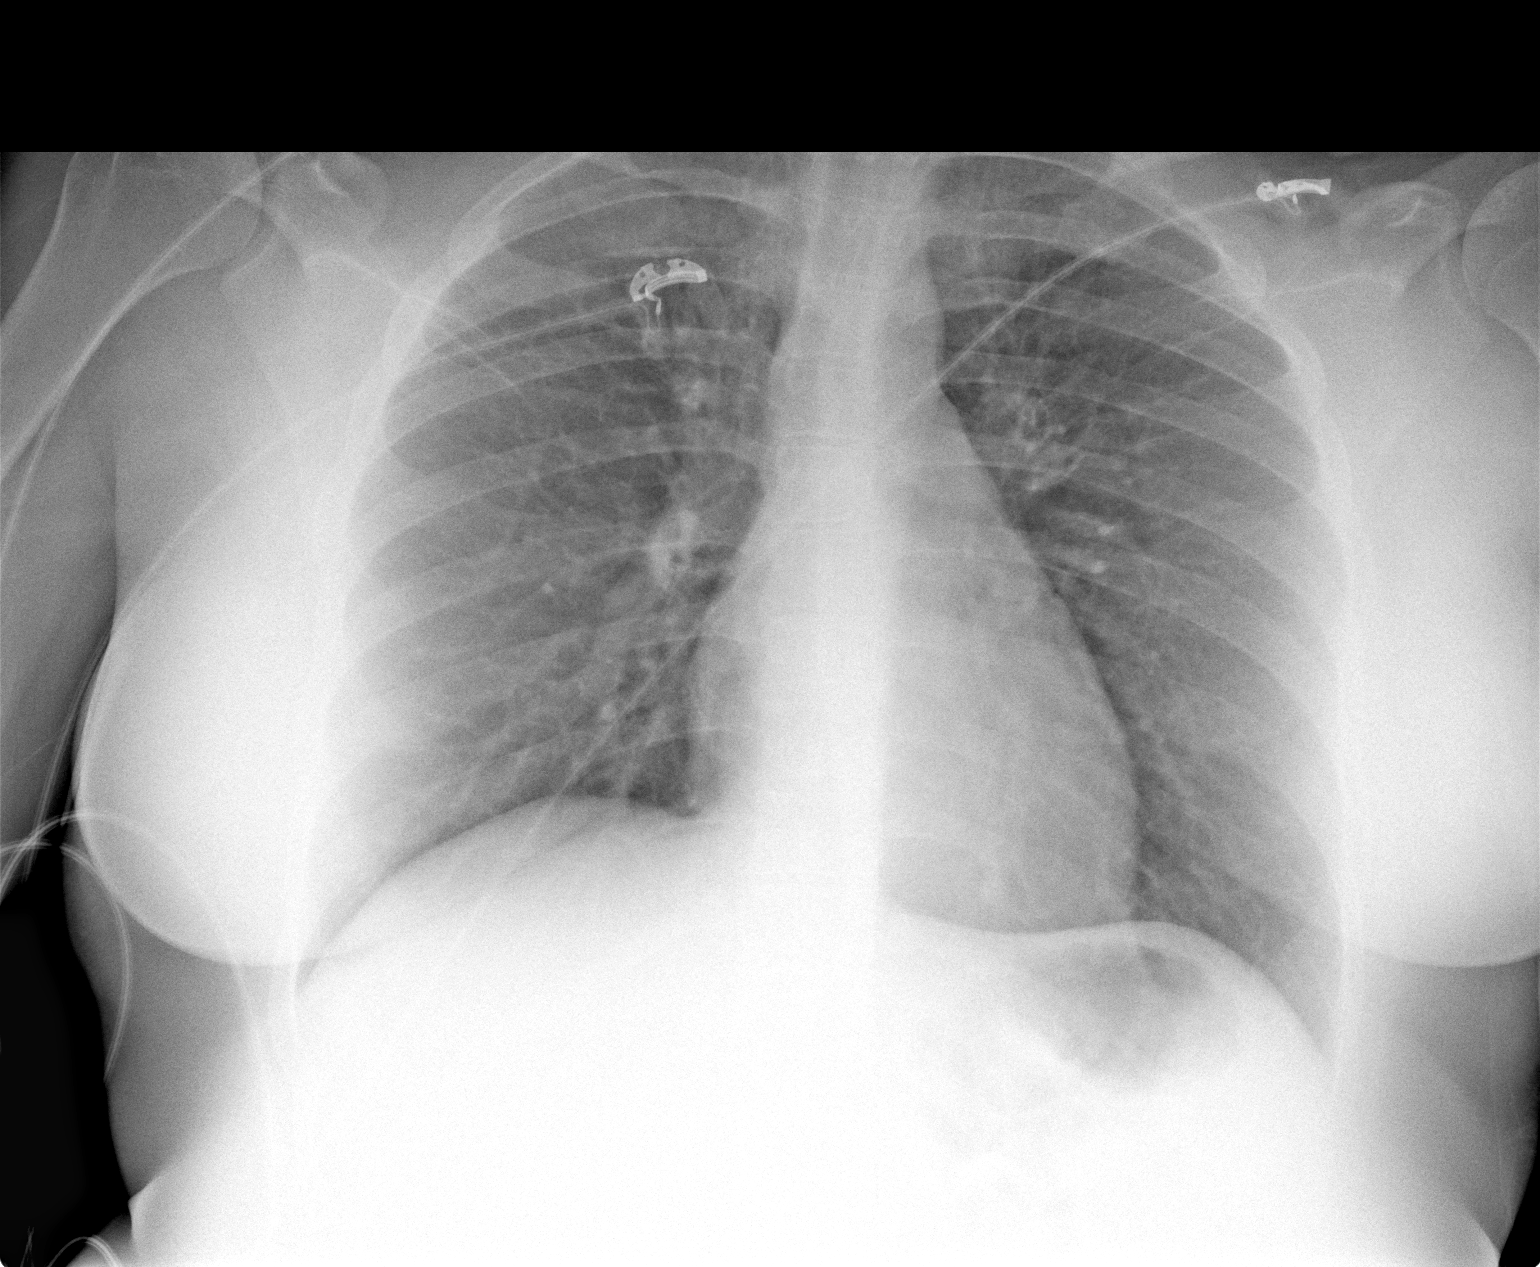

[1 of 1 positions shown; findings below may reference images not displayed]

FINDINGS: Lung volumes are normal.  No consolidative airspace
disease.  No pleural effusions.  No pneumothorax.  No pulmonary
nodule or mass noted.  Pulmonary vasculature and the
cardiomediastinal silhouette are within normal limits.
IMPRESSION: 1. No radiographic evidence of acute cardiopulmonary disease.

## 2014-08-08 ENCOUNTER — Encounter (HOSPITAL_BASED_OUTPATIENT_CLINIC_OR_DEPARTMENT_OTHER): Payer: Self-pay | Admitting: *Deleted

## 2014-08-08 ENCOUNTER — Emergency Department (HOSPITAL_BASED_OUTPATIENT_CLINIC_OR_DEPARTMENT_OTHER)
Admission: EM | Admit: 2014-08-08 | Discharge: 2014-08-08 | Disposition: A | Discharge status: Home / Self Care | Payer: Self-pay | Attending: Emergency Medicine | Admitting: Emergency Medicine

## 2014-08-08 ENCOUNTER — Emergency Department (HOSPITAL_BASED_OUTPATIENT_CLINIC_OR_DEPARTMENT_OTHER): Payer: Self-pay

## 2014-08-08 DIAGNOSIS — Y998 Other external cause status: Secondary | ICD-10-CM | POA: Insufficient documentation

## 2014-08-08 DIAGNOSIS — Y9389 Activity, other specified: Secondary | ICD-10-CM | POA: Insufficient documentation

## 2014-08-08 DIAGNOSIS — X58XXXA Exposure to other specified factors, initial encounter: Secondary | ICD-10-CM | POA: Insufficient documentation

## 2014-08-08 DIAGNOSIS — Z720 Tobacco use: Secondary | ICD-10-CM | POA: Insufficient documentation

## 2014-08-08 DIAGNOSIS — Z8719 Personal history of other diseases of the digestive system: Secondary | ICD-10-CM | POA: Insufficient documentation

## 2014-08-08 DIAGNOSIS — Z792 Long term (current) use of antibiotics: Secondary | ICD-10-CM | POA: Insufficient documentation

## 2014-08-08 DIAGNOSIS — Z79899 Other long term (current) drug therapy: Secondary | ICD-10-CM | POA: Insufficient documentation

## 2014-08-08 DIAGNOSIS — J069 Acute upper respiratory infection, unspecified: Secondary | ICD-10-CM | POA: Insufficient documentation

## 2014-08-08 DIAGNOSIS — Y9289 Other specified places as the place of occurrence of the external cause: Secondary | ICD-10-CM | POA: Insufficient documentation

## 2014-08-08 DIAGNOSIS — S161XXA Strain of muscle, fascia and tendon at neck level, initial encounter: Secondary | ICD-10-CM | POA: Insufficient documentation

## 2014-08-08 DIAGNOSIS — J45901 Unspecified asthma with (acute) exacerbation: Secondary | ICD-10-CM | POA: Insufficient documentation

## 2014-08-08 LAB — RAPID STREP SCREEN (MED CTR MEBANE ONLY): STREPTOCOCCUS, GROUP A SCREEN (DIRECT): NEGATIVE

## 2014-08-08 MED ORDER — DEXAMETHASONE 4 MG PO TABS
10.0000 mg | ORAL_TABLET | Freq: Once | ORAL | Status: AC
  Administered 2014-08-08: 10 mg via ORAL

## 2014-08-08 MED ORDER — KETOROLAC TROMETHAMINE 60 MG/2ML IM SOLN
60.0000 mg | Freq: Once | INTRAMUSCULAR | Status: AC
  Administered 2014-08-08: 60 mg via INTRAMUSCULAR
  Filled 2014-08-08: qty 2

## 2014-08-08 MED ORDER — ALBUTEROL SULFATE HFA 108 (90 BASE) MCG/ACT IN AERS
2.0000 | INHALATION_SPRAY | Freq: Once | RESPIRATORY_TRACT | Status: AC
  Administered 2014-08-08: 2 via RESPIRATORY_TRACT
  Filled 2014-08-08: qty 6.7

## 2014-08-08 MED ORDER — DEXAMETHASONE 4 MG PO TABS
ORAL_TABLET | ORAL | Status: AC
  Filled 2014-08-08: qty 3

## 2014-08-08 NOTE — ED Provider Notes (Signed)
CSN: [id number]     Arrival date & time 08/08/14  4401 History   First MD Initiated Contact with Patient 08/08/14 1024     No chief complaint on file.   The history is provided by the patient. No language interpreter was used.   Gina Lynn presents for evaluation of neck pain/sore throat.  She reports four days of symptoms.  Initially she developed some right sided neck to shoulder soreness four days ago while helping her sister move, pain is worse with ROM.  She denies any injury that day.  The next day she developed sore throat, generalized body aches, cough productive of yellow sputum.  Her body aches are greater in bilateral arms (hands to elbows).  She reports subjective fevers.  Denies numbness, weakness, vomiting, dysuria.  She has a hx/o asthma.  No recent surgery, no IVDA.    Past Medical History  Diagnosis Date  . Asthma   . GERD (gastroesophageal reflux disease)   . Sinusitis    Past Surgical History  Procedure Laterality Date  . Nasal sinus surgery    . Tonsillectomy    . Adenoidectomy    . Mouth surgery     No family history on file. History  Substance Use Topics  . Smoking status: Current Every Day Smoker -- 1.00 packs/day    Types: Cigarettes  . Smokeless tobacco: Not on file  . Alcohol Use: No   OB History    Gravida Para Term Preterm AB TAB SAB Ectopic Multiple Living   1              Review of Systems  All other systems reviewed and are negative.     Allergies  Review of patient's allergies indicates no known allergies.  Home Medications   Prior to Admission medications   Medication Sig Start Date End Date Taking? Authorizing Provider  albuterol (PROVENTIL HFA;VENTOLIN HFA) 108 (90 BASE) MCG/ACT inhaler Inhale into the lungs every 6 (six) hours as needed.   Yes Historical Provider, MD  cephALEXin (KEFLEX) 500 MG capsule Take 1 capsule (500 mg total) by mouth 4 (four) times daily. 01/16/14  Yes [name], MD  DiphenhydrAMINE HCl (BENADRYL  ALLERGY PO) Take 2 tablets by mouth daily as needed. Patient used this medication for the swelling in her arm.   Yes Historical Provider, MD   BP 168/105 mmHg  Pulse 102  Temp(Src) 98.5 F (36.9 C) (Oral)  Resp 18  Ht  (1.727 m)  Wt 182 lb 9.6 oz (82.827 kg)  BMI 27.77 kg/m2  SpO2 97%  LMP 08/07/2014  Breastfeeding? Unknown Physical Exam  Constitutional: She is oriented to person, place, and time. She appears well-developed and well-nourished.  HENT:  Head: Normocephalic and atraumatic.  Right Ear: External ear normal.  Left Ear: External ear normal.  Mouth/Throat: Oropharynx is clear and moist.  Cardiovascular: Normal rate and regular rhythm.   No murmur heard. Pulmonary/Chest: Effort normal. No respiratory distress.  Occasional wheeze in bilateral bases  Abdominal: Soft. There is no tenderness. There is no rebound and no guarding.  Musculoskeletal: She exhibits no edema.  No joint swelling.  TTP and muscle spasm over right trapezius.  No discrete cervical bony tenderness.  Pt able to range neck.    Neurological: She is alert and oriented to person, place, and time.  5/5 strength in all four extremities.  Sensation to light touch intact in all four extremities.    Skin: Skin is warm and dry.  Psychiatric:  She has a normal mood and affect. Her behavior is normal.  Nursing note and vitals reviewed.   ED Course  Procedures (including critical care time) Labs Review Labs Reviewed  RAPID STREP SCREEN (NOT AT [facility])  CULTURE, GROUP A STREP    Imaging Review Dg Chest 2 View  08/08/2014   CLINICAL DATA:  Cough. Initial encounter. Right-sided cervicalgia/neck pain radiating into the RIGHT shoulder. History of asthma. Cough.  EXAM: CHEST  2 VIEW  COMPARISON:  05/08/2014.  FINDINGS: Cardiopericardial silhouette within normal limits. Mediastinal contours normal. Trachea midline. No airspace disease or effusion.  IMPRESSION: No active cardiopulmonary disease.   Electronically  Signed   By: Andreas NewportGeoffrey  Lamke M.D.   On: 08/08/2014 10:52     EKG Interpretation None      MDM   Final diagnoses:  Acute URI  Neck strain, initial encounter    Pt here with neck pain, body aches, cough.  Pt is nontoxic appearing on exam with no acute distress.  Presentation is not c/w epidural abscess, cervical spine injury, RPA, pna.  Pt given toradol for body aches, decadron for mild wheeze/asthma.  Discussed with pt home care for URI as well as cervical strain as well as return precautions.      Tilden FossaElizabeth Jaishon Krisher, MD 08/08/14 1249

## 2014-08-08 NOTE — ED Notes (Signed)
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 C/o sorethroat. Coughing up yellow/brown sputum.

## 2014-08-08 NOTE — Discharge Instructions (Signed)
Cervical Sprain A cervical sprain is an injury in the neck in which the strong, fibrous tissues (ligaments) that connect your neck bones stretch or tear. Cervical sprains can range from mild to severe. Severe cervical sprains can cause the neck vertebrae to be unstable. This can lead to damage of the spinal cord and can result in serious nervous system problems. The amount of time it takes for a cervical sprain to get better depends on the cause and extent of the injury. Most cervical sprains heal in 1 to 3 weeks. CAUSES  Severe cervical sprains may be caused by:   Contact sport injuries (such as from football, rugby, wrestling, hockey, auto racing, gymnastics, diving, martial arts, or boxing).   Motor vehicle collisions.   Whiplash injuries. This is an injury from a sudden forward and backward whipping movement of the head and neck.  Falls.  Mild cervical sprains may be caused by:   Being in an awkward position, such as while cradling a telephone between your ear and shoulder.   Sitting in a chair that does not offer proper support.   Working at a poorly designed computer station.   Looking up or down for long periods of time.  SYMPTOMS   Pain, soreness, stiffness, or a burning sensation in the front, back, or sides of the neck. This discomfort may develop immediately after the injury or slowly, 24 hours or more after the injury.   Pain or tenderness directly in the middle of the back of the neck.   Shoulder or upper back pain.   Limited ability to move the neck.   Headache.   Dizziness.   Weakness, numbness, or tingling in the hands or arms.   Muscle spasms.   Difficulty swallowing or chewing.   Tenderness and swelling of the neck.  DIAGNOSIS  Most of the time your health care provider can diagnose a cervical sprain by taking your history and doing a physical exam. Your health care provider will ask about previous neck injuries and any known neck  problems, such as arthritis in the neck. X-rays may be taken to find out if there are any other problems, such as with the bones of the neck. Other tests, such as a CT scan or MRI, may also be needed.  TREATMENT  Treatment depends on the severity of the cervical sprain. Mild sprains can be treated with rest, keeping the neck in place (immobilization), and pain medicines. Severe cervical sprains are immediately immobilized. Further treatment is done to help with pain, muscle spasms, and other symptoms and may include:  Medicines, such as pain relievers, numbing medicines, or muscle relaxants.   Physical therapy. This may involve stretching exercises, strengthening exercises, and posture training. Exercises and improved posture can help stabilize the neck, strengthen muscles, and help stop symptoms from returning.  HOME CARE INSTRUCTIONS   Put ice on the injured area.   Put ice in a plastic bag.   Place a towel between your skin and the bag.   Leave the ice on for 15-20 minutes, 3-4 times a day.   If your injury was severe, you may have been given a cervical collar to wear. A cervical collar is a two-piece collar designed to keep your neck from moving while it heals.  Do not remove the collar unless instructed by your health care provider.  If you have long hair, keep it outside of the collar.  Ask your health care provider before making any adjustments to your collar. Minor   adjustments may be required over time to improve comfort and reduce pressure on your chin or on the back of your head.  Ifyou are allowed to remove the collar for cleaning or bathing, follow your health care provider's instructions on how to do so safely.  Keep your collar clean by wiping it with mild soap and water and drying it completely. If the collar you have been given includes removable pads, remove them every 1-2 days and hand wash them with soap and water. Allow them to air dry. They should be completely  dry before you wear them in the collar.  If you are allowed to remove the collar for cleaning and bathing, wash and dry the skin of your neck. Check your skin for irritation or sores. If you see any, tell your health care provider.  Do not drive while wearing the collar.   Only take over-the-counter or prescription medicines for pain, discomfort, or fever as directed by your health care provider.   Keep all follow-up appointments as directed by your health care provider.   Keep all physical therapy appointments as directed by your health care provider.   Make any needed adjustments to your workstation to promote good posture.   Avoid positions and activities that make your symptoms worse.   Warm up and stretch before being active to help prevent problems.  SEEK MEDICAL CARE IF:   Your pain is not controlled with medicine.   You are unable to decrease your pain medicine over time as planned.   Your activity level is not improving as expected.  SEEK IMMEDIATE MEDICAL CARE IF:   You develop any bleeding.  You develop stomach upset.  You have signs of an allergic reaction to your medicine.   Your symptoms get worse.   You develop new, unexplained symptoms.   You have numbness, tingling, weakness, or paralysis in any part of your body.  MAKE SURE YOU:   Understand these instructions.  Will watch your condition.  Will get help right away if you are not doing well or get worse. Document Released: 11/09/2006 Document Revised: 01/17/2013 Document Reviewed: 07/20/2012 Exit[employer] Patient Information 2015 [employer]. This information is not intended to replace advice given to you by your health care provider. Make sure you discuss any questions you have with your health care provider. Upper Respiratory Infection, Adult An upper respiratory infection (URI) is also sometimes known as the common cold. The upper respiratory tract includes the nose, sinuses, throat,  trachea, and bronchi. Bronchi are the airways leading to the lungs. Most people improve within 1 week, but symptoms can last up to 2 weeks. A residual cough may last even longer.  CAUSES Many different viruses can infect the tissues lining the upper respiratory tract. The tissues become irritated and inflamed and often become very moist. Mucus production is also common. A cold is contagious. You can easily spread the virus to others by oral contact. This includes kissing, sharing a glass, coughing, or sneezing. Touching your mouth or nose and then touching a surface, which is then touched by another person, can also spread the virus. SYMPTOMS  Symptoms typically develop 1 to 3 days after you come in contact with a cold virus. Symptoms vary from person to person. They may include:  Runny nose.  Sneezing.  Nasal congestion.  Sinus irritation.  Sore throat.  Loss of voice (laryngitis).  Cough.  Fatigue.  Muscle aches.  Loss of appetite.  Headache.  Low-grade fever. DIAGNOSIS  You   might diagnose your own cold based on familiar symptoms, since most people get a cold 2 to 3 times a year. Your caregiver can confirm this based on your exam. Most importantly, your caregiver can check that your symptoms are not due to another disease such as strep throat, sinusitis, pneumonia, asthma, or epiglottitis. Blood tests, throat tests, and X-rays are not necessary to diagnose a common cold, but they may sometimes be helpful in excluding other more serious diseases. Your caregiver will decide if any further tests are required. RISKS AND COMPLICATIONS  You may be at risk for a more severe case of the common cold if you smoke cigarettes, have chronic heart disease (such as heart failure) or lung disease (such as asthma), or if you have a weakened immune system. The very young and very old are also at risk for more serious infections. Bacterial sinusitis, middle ear infections, and bacterial pneumonia can  complicate the common cold. The common cold can worsen asthma and chronic obstructive pulmonary disease (COPD). Sometimes, these complications can require emergency medical care and may be life-threatening. PREVENTION  The best way to protect against getting a cold is to practice good hygiene. Avoid oral or hand contact with people with cold symptoms. Wash your hands often if contact occurs. There is no clear evidence that vitamin C, vitamin E, echinacea, or exercise reduces the chance of developing a cold. However, it is always recommended to get plenty of rest and practice good nutrition. TREATMENT  Treatment is directed at relieving symptoms. There is no cure. Antibiotics are not effective, because the infection is caused by a virus, not by bacteria. Treatment may include:  Increased fluid intake. Sports drinks offer valuable electrolytes, sugars, and fluids.  Breathing heated mist or steam (vaporizer or shower).  Eating chicken soup or other clear broths, and maintaining good nutrition.  Getting plenty of rest.  Using gargles or lozenges for comfort.  Controlling fevers with ibuprofen or acetaminophen as directed by your caregiver.  Increasing usage of your inhaler if you have asthma. Zinc gel and zinc lozenges, taken in the first 24 hours of the common cold, can shorten the duration and lessen the severity of symptoms. Pain medicines may help with fever, muscle aches, and throat pain. A variety of non-prescription medicines are available to treat congestion and runny nose. Your caregiver can make recommendations and may suggest nasal or lung inhalers for other symptoms.  HOME CARE INSTRUCTIONS   Only take over-the-counter or prescription medicines for pain, discomfort, or fever as directed by your caregiver.  Use a warm mist humidifier or inhale steam from a shower to increase air moisture. This may keep secretions moist and make it easier to breathe.  Drink enough water and fluids to  keep your urine clear or pale yellow.  Rest as needed.  Return to work when your temperature has returned to normal or as your caregiver advises. You may need to stay home longer to avoid infecting others. You can also use a face mask and careful hand washing to prevent spread of the virus. SEEK MEDICAL CARE IF:   After the first few days, you feel you are getting worse rather than better.  You need your caregiver's advice about medicines to control symptoms.  You develop chills, worsening shortness of breath, or brown or red sputum. These may be signs of pneumonia.  You develop yellow or brown nasal discharge or pain in the face, especially when you bend forward. These may be signs of sinusitis.    You develop a fever, swollen neck glands, pain with swallowing, or white areas in the back of your throat. These may be signs of strep throat. SEEK IMMEDIATE MEDICAL CARE IF:   You have a fever.  You develop severe or persistent headache, ear pain, sinus pain, or chest pain.  You develop wheezing, a prolonged cough, cough up blood, or have a change in your usual mucus (if you have chronic lung disease).  You develop sore muscles or a stiff neck. Document Released: 07/08/2000 Document Revised: 04/06/2011 Document Reviewed: 04/19/2013 [employer] Patient Information 2015 [employer]. This information is not intended to replace advice given to you by your health care provider. Make sure you discuss any questions you have with your health care provider.  

## 2014-08-10 LAB — CULTURE, GROUP A STREP: Strep A Culture: NEGATIVE

## 2016-01-03 IMAGING — US US OB COMP LESS 14 WK
1 series · 13 of 28 positions shown · non-contrast
Comparison: None.

CLINICAL DATA: Vaginal bleeding and pain

EXAM:
OBSTETRIC <14 WK US AND TRANSVAGINAL OB US
TECHNIQUE: Both transabdominal and transvaginal ultrasound examinations were
performed for complete evaluation of the gestation as well as the
maternal uterus, adnexal regions, and pelvic cul-de-sac.
Transvaginal technique was performed to assess early pregnancy.

[Series 1: us ob comp less 14 wk · 0.21mm/px · 13 of 65 slices shown]
[im 3/65]
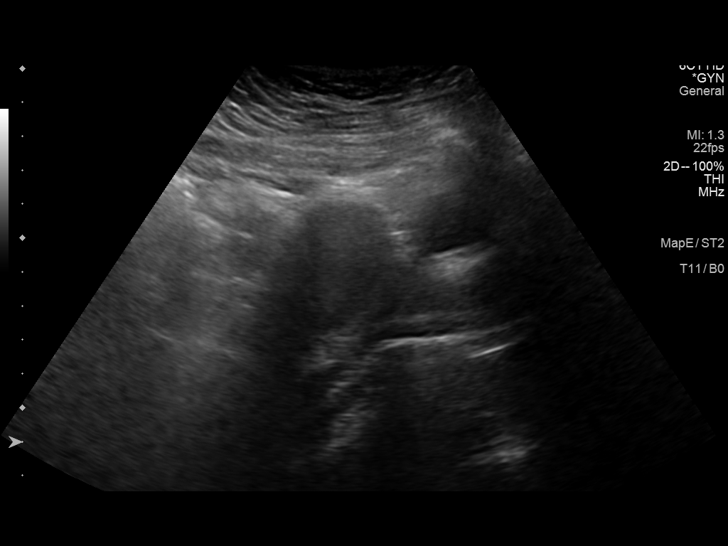
[im 8/65]
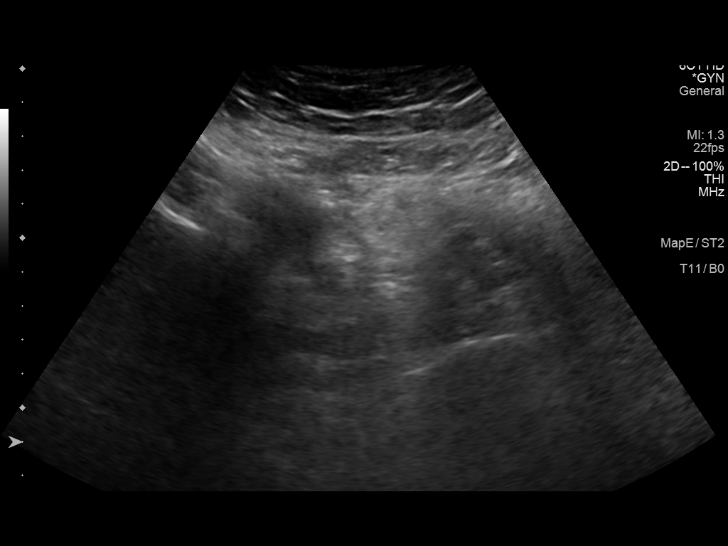
[im 12/65]
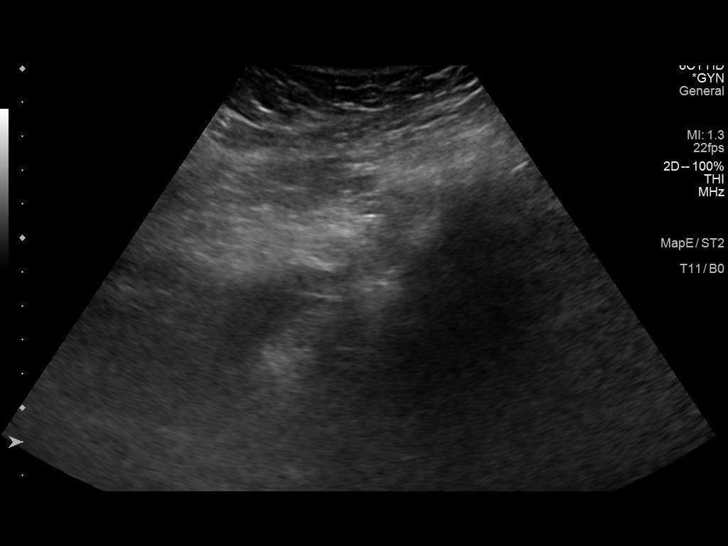
[im 17/65]
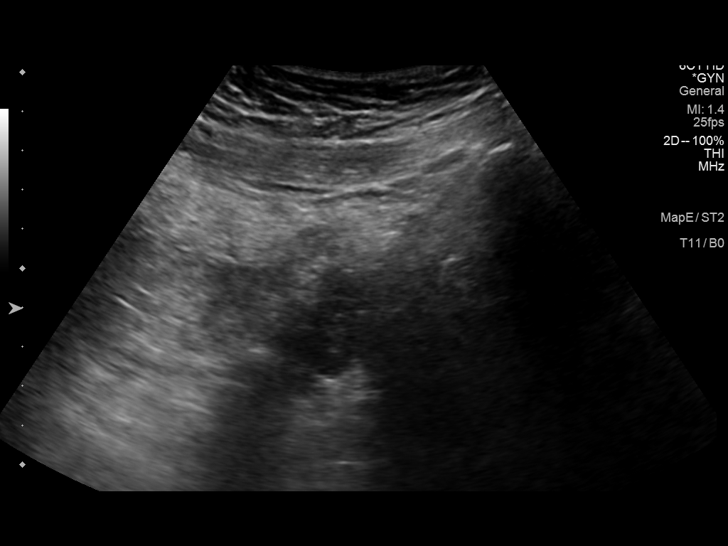
[im 22/65]
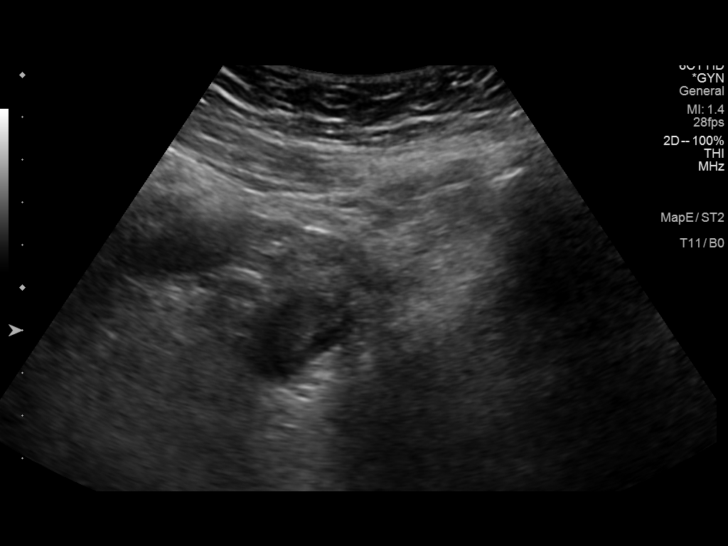
[im 27/65]
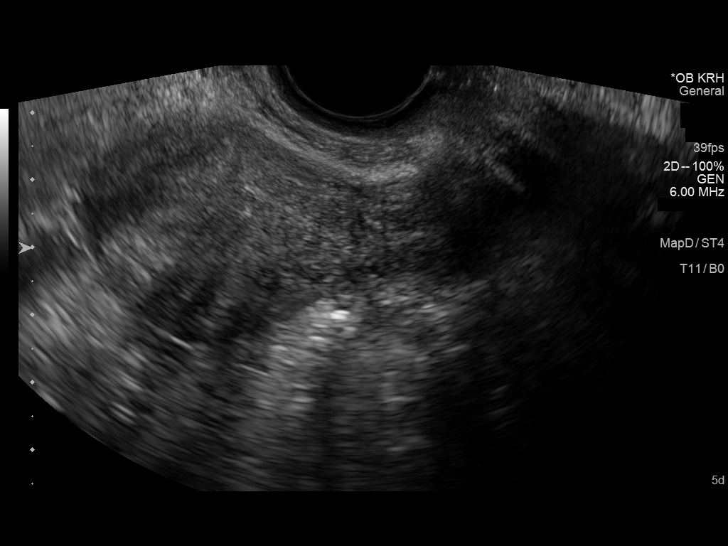
[im 34/65]
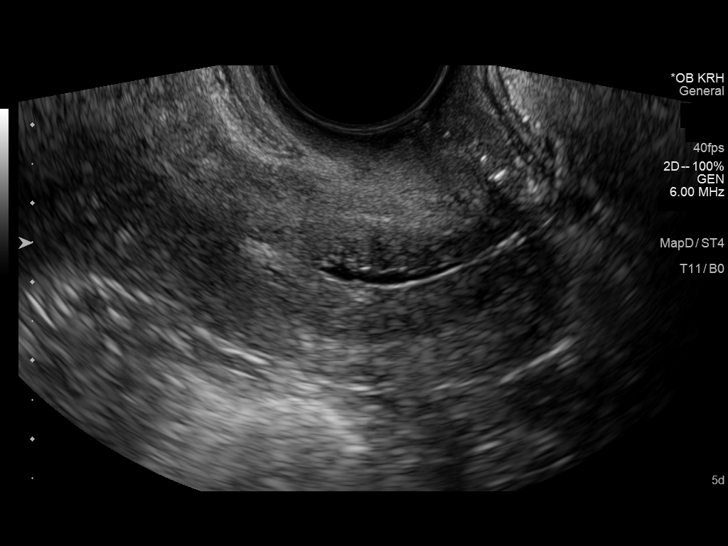
[im 38/65]
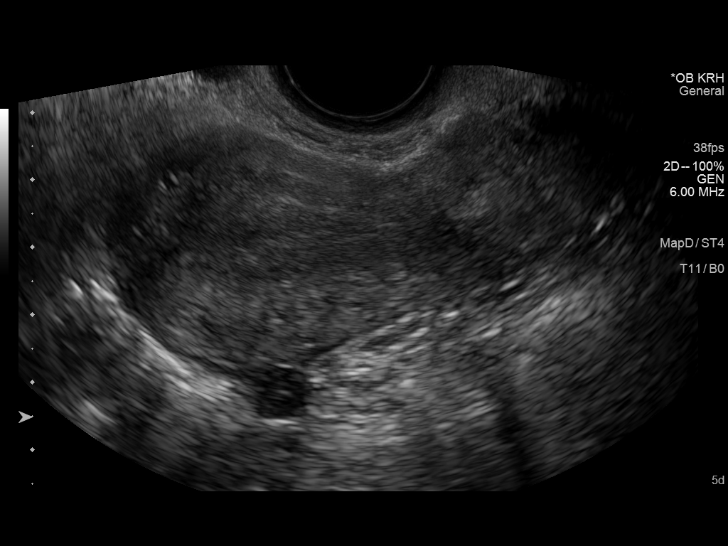
[im 43/65]
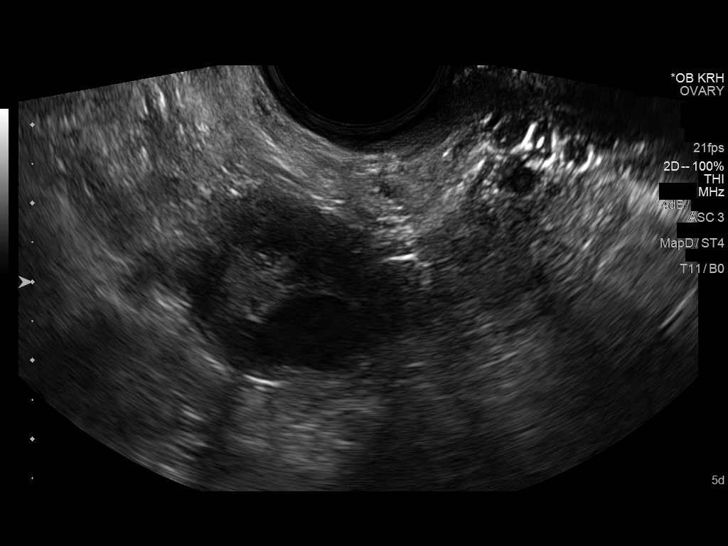
[im 48/65]
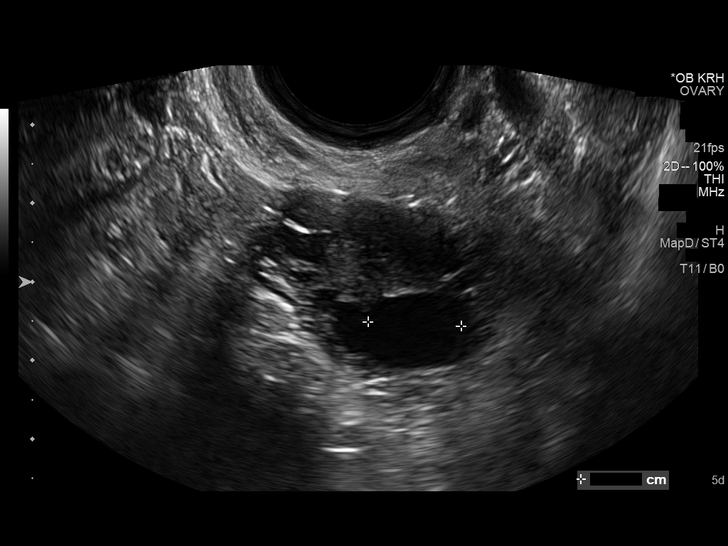
[im 53/65]
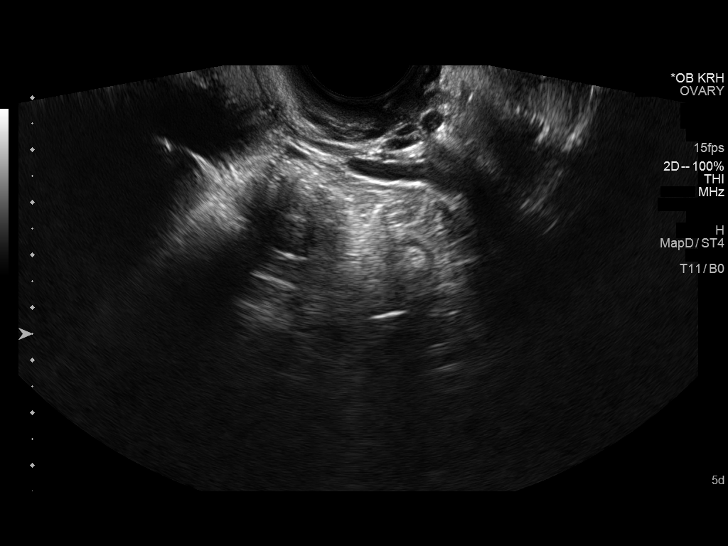
[im 57/65]
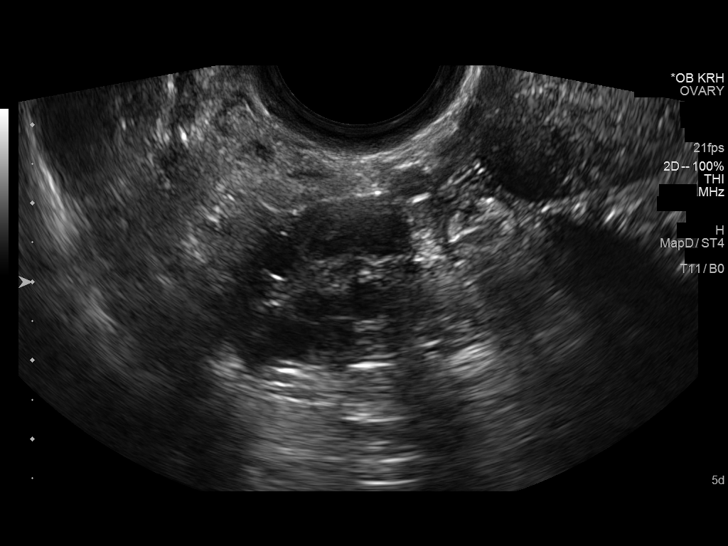
[im 62/65]
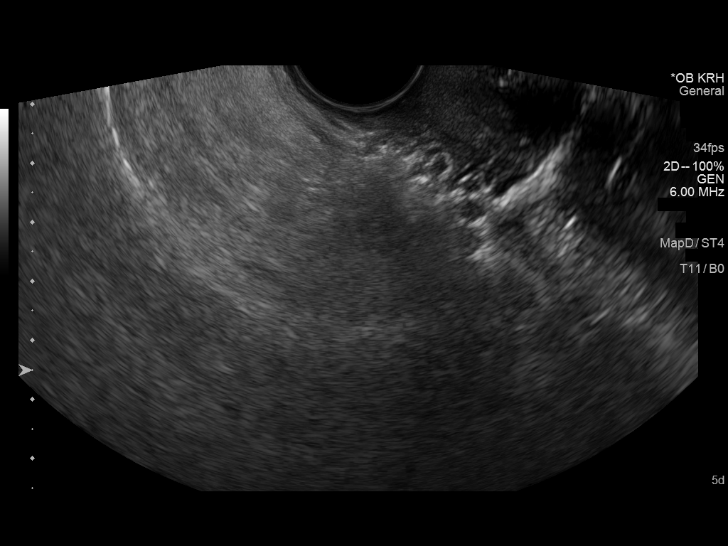

[13 of 28 positions shown; findings below may reference images not displayed]

FINDINGS: There is no intrauterine gestation currently. The uterus measures
5.3 x 3.8 by 5.7 cm. There is a hypoechoic mass arising from the
right posterior aspect of the uterus fundus measuring 0.9 x 1.1 x
0.8 cm which probably represents a small eccentric leiomyoma. No
other uterine mass. The endometrium measures 7 mm in thickness with
a smooth contour. There is a small amount of fluid in the cervical
os.

The right ovary measures 2.9 x 1.5 x 2.3 cm. Left ovary measures
x 2.1 x 1.6 cm. There is a dominant follicle arising from left ovary
measuring 1.3 x 1.0 cm. No extrauterine pelvic or adnexal masses are
identified beyond this dominant follicle arising from left ovary.
There is no appreciable free pelvic fluid.
IMPRESSION: No intrauterine gestation. Small leiomyoma arising from the right
uterus. Small amount of fluid in cervical os.

Given these findings, differential considerations include
intrauterine gestation too early to be seen by either transabdominal
or transvaginal technique; recent spontaneous abortion, or possibly
ectopic gestation. These findings warrant close clinical
surveillance as well as beta HCG follow-up. Repeat timing of
ultrasound will in large part depend on beta HCG findings.

## 2016-01-03 IMAGING — US US RENAL
1 series · 14 of 19 positions shown · non-contrast
Comparison: Pelvic ultrasound earlier the same day.

CLINICAL DATA: Left lower quadrant/left flank pain.

EXAM:
RENAL/URINARY TRACT ULTRASOUND COMPLETE

[Series 1: us renal · 0.17mm/px · 14 of 19 slices shown]
[im 1/19]
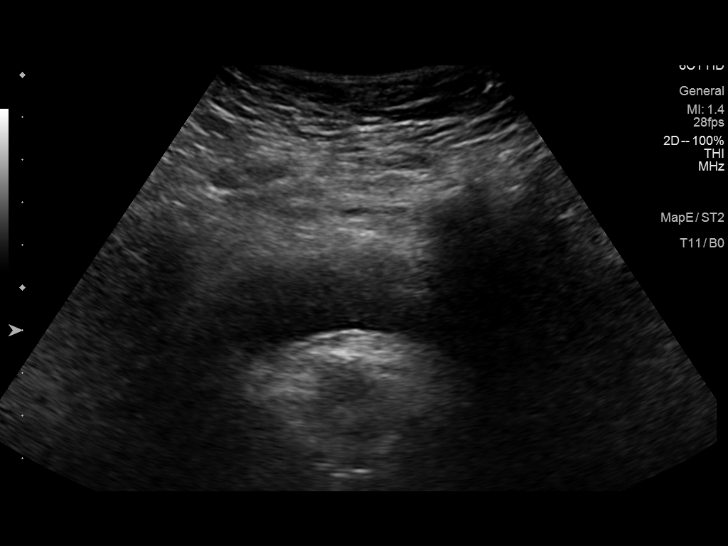
[im 3/19]
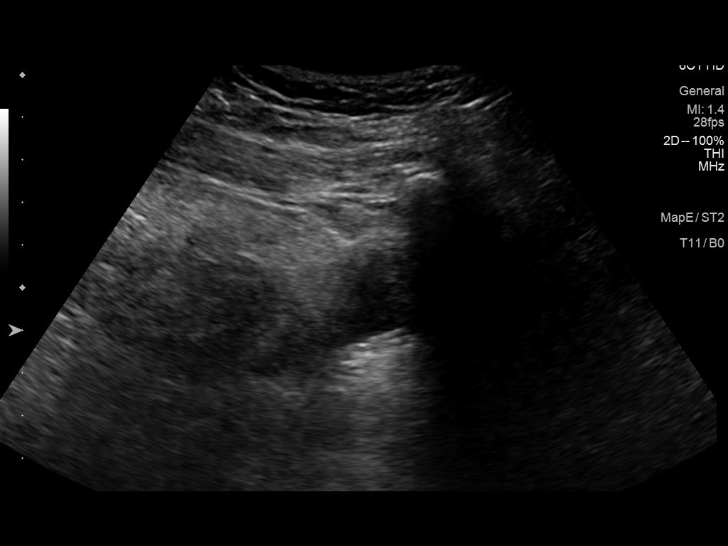
[im 4/19]
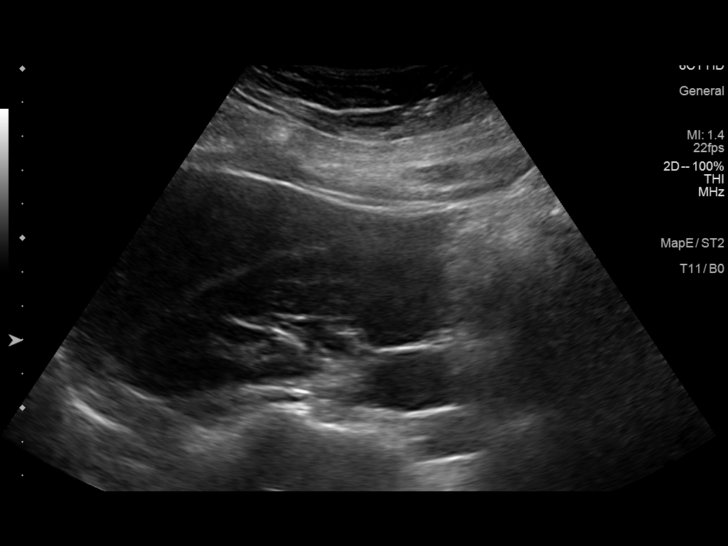
[im 5/19]
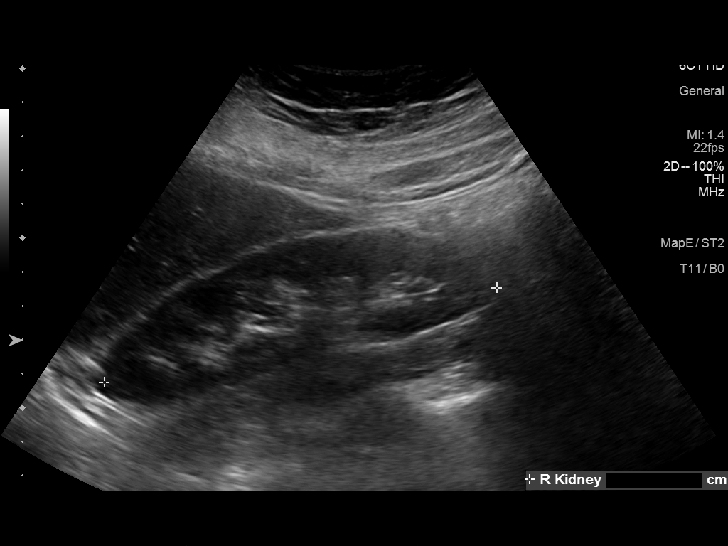
[im 7/19]
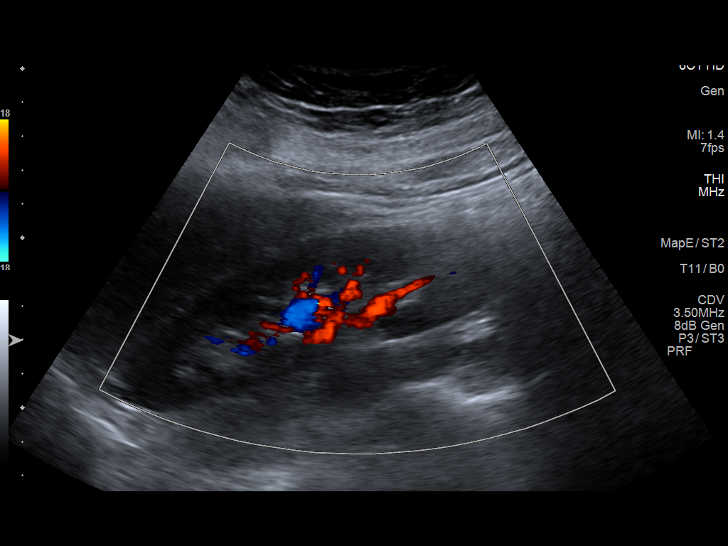
[im 8/19]
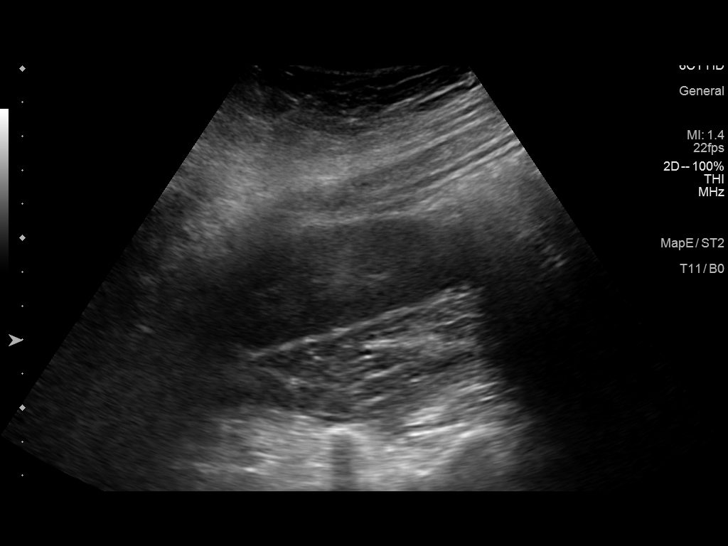
[im 9/19]
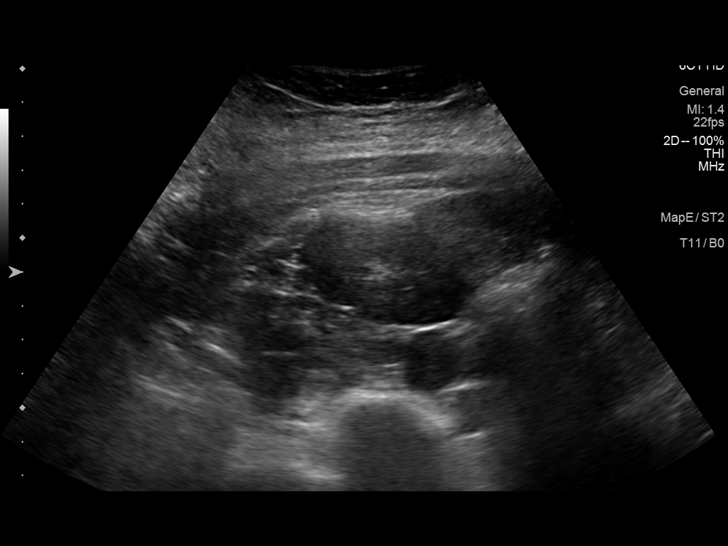
[im 11/19]
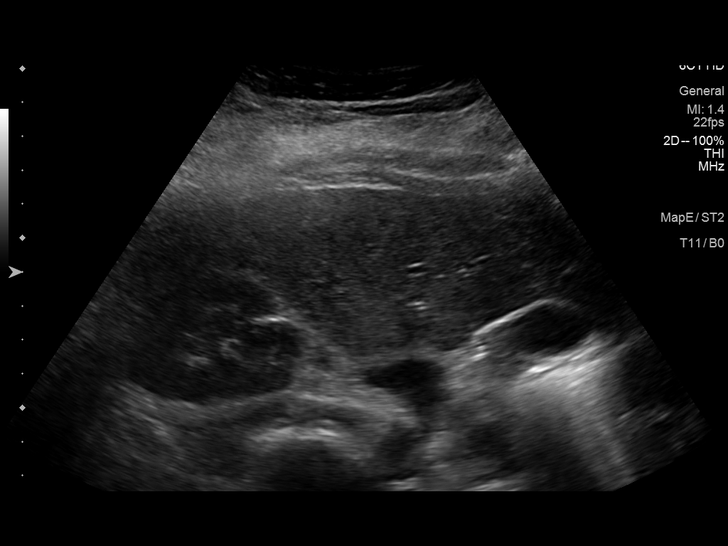
[im 12/19]
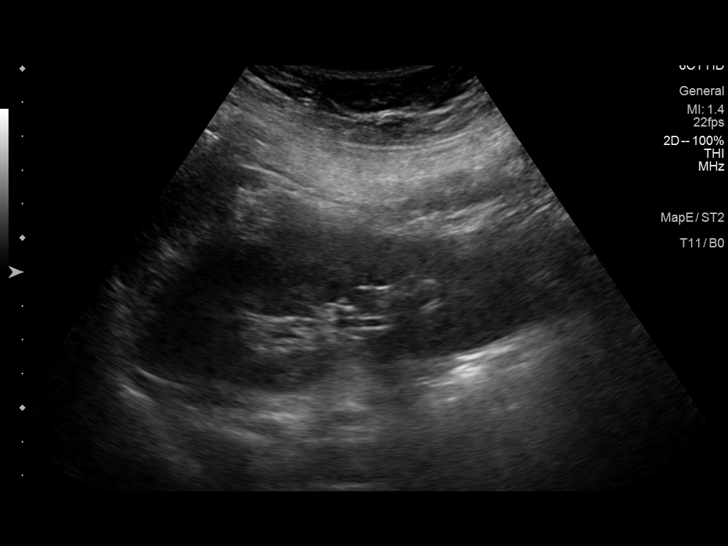
[im 13/19]
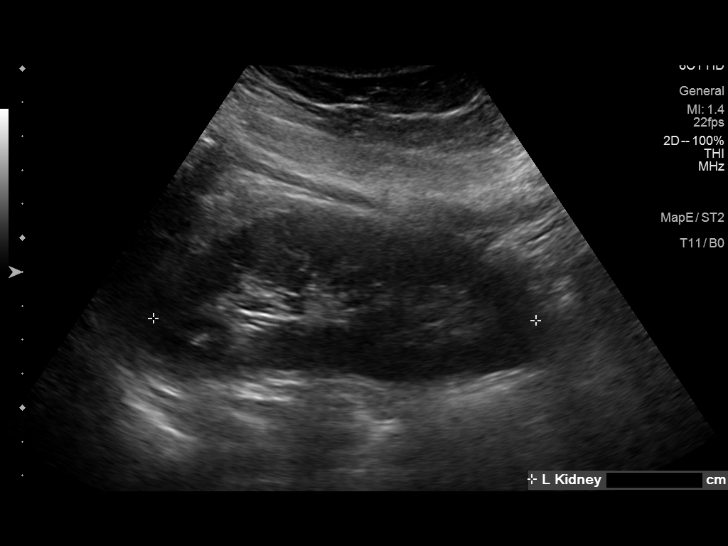
[im 15/19]
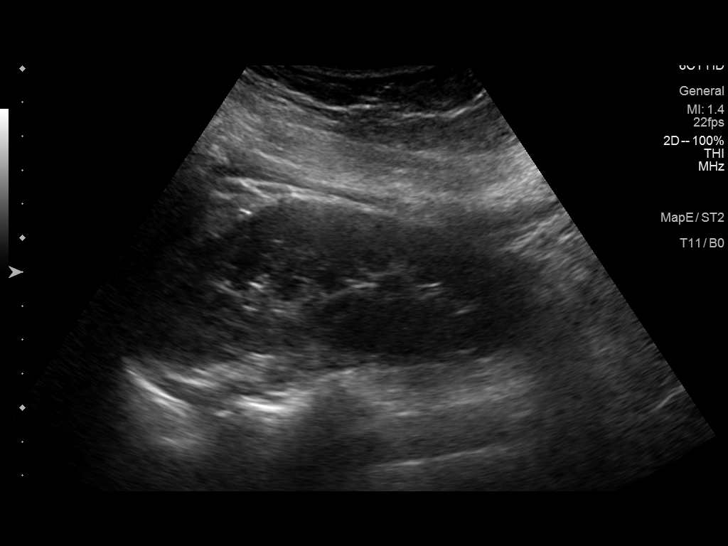
[im 16/19]
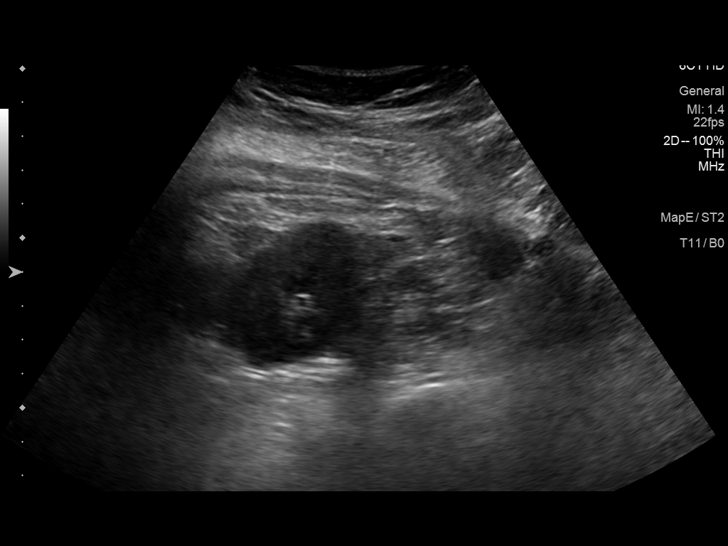
[im 17/19]
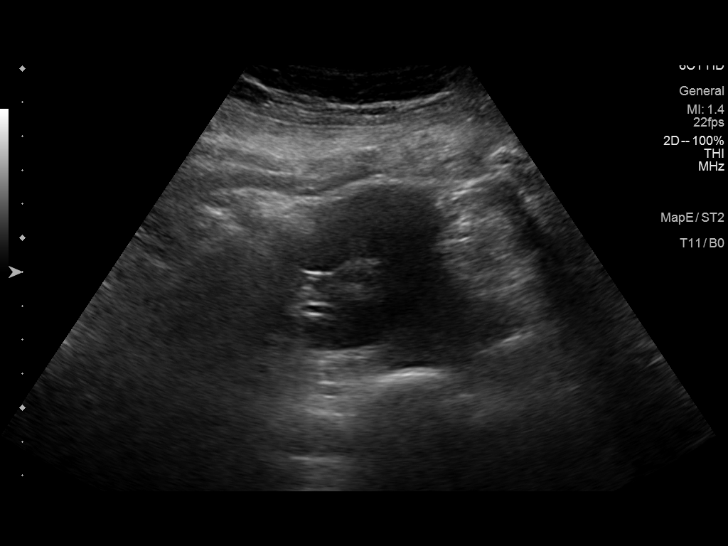
[im 19/19]
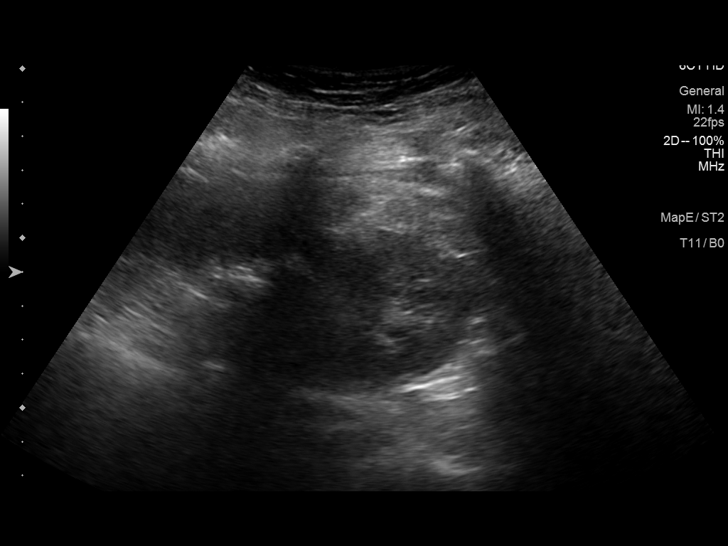

[14 of 19 positions shown; findings below may reference images not displayed]

FINDINGS: Right Kidney:

Length: 11.9 cm. Echogenicity within normal limits. No mass or
hydronephrosis visualized.

Left Kidney:

Length: 11.3 cm. Echogenicity within normal limits. No mass or
hydronephrosis visualized.

Bladder:

Decompressed and not well evaluated.
IMPRESSION: Normal sonographic appearance of the kidneys. The bladder is
decompressed and not well evaluated.

## 2016-01-10 ENCOUNTER — Encounter (HOSPITAL_BASED_OUTPATIENT_CLINIC_OR_DEPARTMENT_OTHER): Payer: Self-pay | Admitting: *Deleted

## 2016-01-10 ENCOUNTER — Emergency Department (HOSPITAL_BASED_OUTPATIENT_CLINIC_OR_DEPARTMENT_OTHER)
Admission: EM | Admit: 2016-01-10 | Discharge: 2016-01-10 | Disposition: A | Discharge status: Home / Self Care | Payer: Self-pay | Attending: Emergency Medicine | Admitting: Emergency Medicine

## 2016-01-10 ENCOUNTER — Emergency Department (HOSPITAL_BASED_OUTPATIENT_CLINIC_OR_DEPARTMENT_OTHER): Payer: Self-pay

## 2016-01-10 DIAGNOSIS — Z79899 Other long term (current) drug therapy: Secondary | ICD-10-CM | POA: Insufficient documentation

## 2016-01-10 DIAGNOSIS — F17210 Nicotine dependence, cigarettes, uncomplicated: Secondary | ICD-10-CM | POA: Insufficient documentation

## 2016-01-10 DIAGNOSIS — J4521 Mild intermittent asthma with (acute) exacerbation: Secondary | ICD-10-CM | POA: Insufficient documentation

## 2016-01-10 MED ORDER — PREDNISONE 50 MG PO TABS
60.0000 mg | ORAL_TABLET | Freq: Once | ORAL | Status: AC
  Administered 2016-01-10: 60 mg via ORAL
  Filled 2016-01-10: qty 1

## 2016-01-10 MED ORDER — PREDNISONE 20 MG PO TABS: ORAL_TABLET | ORAL | 0 refills | Status: DC

## 2016-01-10 MED ORDER — ALBUTEROL SULFATE HFA 108 (90 BASE) MCG/ACT IN AERS
2.0000 | INHALATION_SPRAY | RESPIRATORY_TRACT | Status: DC | PRN
  Administered 2016-01-10: 2 via RESPIRATORY_TRACT
  Filled 2016-01-10: qty 6.7

## 2016-01-10 MED ORDER — ALBUTEROL SULFATE (2.5 MG/3ML) 0.083% IN NEBU
INHALATION_SOLUTION | RESPIRATORY_TRACT | Status: AC
  Administered 2016-01-10: 2.5 mg
  Filled 2016-01-10: qty 6

## 2016-01-10 NOTE — Discharge Instructions (Signed)
Take prednisone starting tomorrow, you have already had today's dose.  Continue using albuterol when needed for wheezing. Follow-up with your primary care doctor. Return to the ED for new or worsening symptoms.

## 2016-01-10 NOTE — ED Triage Notes (Signed)
Asthma hx. Difficulty breathing since yesterday. Cold for a week.

## 2016-01-10 NOTE — ED Provider Notes (Signed)
[facility] Provider Note   CSN: [id number] Arrival date & time: 01/10/16  1857  By signing my name below, I, Emmanuella Mensah, attest that this documentation has been prepared under the direction and in the presence of Sharilyn SitesLisa Alverto Shedd, PA-C. Electronically Signed: Angelene GiovanniEmmanuella Mensah, ED Scribe. 01/10/16. 7:28 PM.   History   Chief Complaint Chief Complaint  Patient presents with  . Shortness of Breath    HPI Comments: Gina Lynn is a 25 y.o. female with a hx of asthma who presents to the Emergency Department complaining of gradual onset, persistent moderate difficulty breathing onset yesterday consistent with her asthma exacerbation. She reports associated productive cough with yellow and green sputum onset one week ago. She reports multiple sick contacts at home with URI symptoms. No alleviating factors noted. Pt has tried albuterol inhaler PTA with no relief. She has NKDA. Pt denies any fever, chills, vomiting, generalized rash, or any other symptoms.   The history is provided by the patient. No language interpreter was used.    Past Medical History:  Diagnosis Date  . Asthma   . GERD (gastroesophageal reflux disease)   . Sinusitis     Patient Active Problem List   Diagnosis Date Noted  . Asthma, mild intermittent 08/10/2012    Past Surgical History:  Procedure Laterality Date  . ADENOIDECTOMY    . MOUTH SURGERY    . NASAL SINUS SURGERY    . TONSILLECTOMY      OB History    Gravida Para Term Preterm AB Living   1             SAB TAB Ectopic Multiple Live Births                   Home Medications    Prior to Admission medications   Medication Sig Start Date End Date Taking? Authorizing Provider  albuterol (PROVENTIL HFA;VENTOLIN HFA) 108 (90 BASE) MCG/ACT inhaler Inhale into the lungs every 6 (six) hours as needed.    Historical Provider, MD  cephALEXin (KEFLEX) 500 MG capsule Take 1 capsule (500 mg total) by mouth 4 (four) times daily. 01/16/14    Glynn OctaveStephen Rancour, MD  DiphenhydrAMINE HCl (BENADRYL ALLERGY PO) Take 2 tablets by mouth daily as needed. Patient used this medication for the swelling in her arm.    Historical Provider, MD    Family History No family history on file.  Social History Social History  Substance Use Topics  . Smoking status: Current Every Day Smoker    Packs/day: 1.00    Types: Cigarettes  . Smokeless tobacco: Never Used  . Alcohol use No     Allergies   Patient has no known allergies.   Review of Systems Review of Systems  Constitutional: Negative for chills and fever.  Respiratory: Positive for cough, shortness of breath and wheezing.   Gastrointestinal: Negative for vomiting.  Skin: Negative for rash.  All other systems reviewed and are negative.    Physical Exam Updated Vital Signs BP (!) 142/106   Pulse (!) 128 Comment: pt used inhaler x 2 puffs on way here  Temp 98.2 F (36.8 C) (Oral)   Resp 20   Ht 5\' 9"  (1.753 m)   Wt 150 lb (68 kg)   LMP 01/03/2016   SpO2 97%   BMI 22.15 kg/m   Physical Exam  Constitutional: She is oriented to person, place, and time. She appears well-developed and well-nourished.  HENT:  Head: Normocephalic and atraumatic.  Right Ear:  Tympanic membrane normal.  Left Ear: Tympanic membrane normal.  Nose: Mucosal edema present.  Mouth/Throat: Uvula is midline, oropharynx is clear and moist and mucous membranes are normal.  + nasal congestion, rhinorrhea  Eyes: Conjunctivae and EOM are normal. Pupils are equal, round, and reactive to light.  Neck: Normal range of motion.  Cardiovascular: Normal rate, regular rhythm and normal heart sounds.   Pulmonary/Chest: Effort normal. She has wheezes.  Diffuse expiratory wheezes noted, no distress, able to speak in sentences without difficulty  Abdominal: Soft. Bowel sounds are normal.  Musculoskeletal: Normal range of motion.  Neurological: She is alert and oriented to person, place, and time.  Skin: Skin is  warm and dry.  Psychiatric: She has a normal mood and affect.  Nursing note and vitals reviewed.    ED Treatments / Results  DIAGNOSTIC STUDIES: Oxygen Saturation is 97% on RA, adequate by my interpretation.    COORDINATION OF CARE: 7:27 PM- Pt advised of plan for treatment and pt agrees. Pt will receive chest x-ray for further evaluation.   Labs (all labs ordered are listed, but only abnormal results are displayed) Labs Reviewed - No data to display  EKG  EKG Interpretation None       Radiology Dg Chest 2 View  Result Date: 01/10/2016 CLINICAL DATA:  Productive cough, wheezing EXAM: CHEST  2 VIEW COMPARISON:  08/08/2014 FINDINGS: Heart and mediastinal contours are within normal limits. No focal opacities or effusions. No acute bony abnormality. IMPRESSION: No active cardiopulmonary disease. Electronically Signed   By: Charlett Nose M.D.   On: 01/10/2016 19:50    Procedures Procedures (including critical care time)  Medications Ordered in ED Medications  albuterol (PROVENTIL HFA;VENTOLIN HFA) 108 (90 Base) MCG/ACT inhaler 2 puff (2 puffs Inhalation Given 01/10/16 2020)  albuterol (PROVENTIL) (2.5 MG/3ML) 0.083% nebulizer solution (2.5 mg  Given 01/10/16 1928)  predniSONE (DELTASONE) tablet 60 mg (60 mg Oral Given 01/10/16 1937)     Initial Impression / Assessment and Plan / ED Course  Sharilyn Sites, PA-C hasreviewed the triage vital signs and the nursing notes.  Pertinent labs & imaging results that were available during my care of the patient were reviewed by me and considered in my medical decision making (see chart for details).  Clinical Course    25 year old female here with shortness breath and wheezing. Reports recent URI type symptoms and sick contacts at home. She is afebrile and nontoxic.  Patient is tachycardic on arrival here, used her albuterol inhaler just prior to arrival without relief. She has diffuse expiratory wheezes on exam. No increased work of  breathing. Denies any chest pain. Chest x-ray was obtained without any acute findings. Patient was treated here with albuterol neb and dose of prednisone with resolution of symptoms. States she is feeling better. She does remain tachycardic which I suspect is from the albuterol. She is not currently on exogenous estrogens, no history of DVT or PE and I have low suspicion for such. She'll be discharged home with supportive care. Given another inhaler here in ED, prednisone taper for home.  FU with PCP.  Discussed plan with patient, she acknowledged understanding and agreed with plan of care.  Return precautions given for new or worsening symptoms.   Final Clinical Impressions(s) / ED Diagnoses   Final diagnoses:  Mild intermittent asthma with exacerbation    New Prescriptions Discharge Medication List as of 01/10/2016  8:29 PM    START taking these medications   Details  predniSONE (DELTASONE) 20  MG tablet Take 40 mg by mouth daily for 3 days, then 20mg  by mouth daily for 3 days, then 10mg  daily for 3 days, Print       I personally performed the services described in this documentation, which was scribed in my presence. The recorded information has been reviewed and is accurate.   Garlon HatchetLisa M Marjoria Mancillas, PA-C 01/10/16 2050    Raeford RazorStephen Kohut, MD 01/14/16 1115

## 2021-01-23 ENCOUNTER — Encounter (HOSPITAL_BASED_OUTPATIENT_CLINIC_OR_DEPARTMENT_OTHER): Payer: Self-pay

## 2021-01-23 ENCOUNTER — Other Ambulatory Visit: Payer: Self-pay

## 2021-01-23 ENCOUNTER — Emergency Department (HOSPITAL_BASED_OUTPATIENT_CLINIC_OR_DEPARTMENT_OTHER)
Admission: EM | Admit: 2021-01-23 | Discharge: 2021-01-23 | Disposition: A | Discharge status: Home / Self Care | Payer: Self-pay | Attending: Emergency Medicine | Admitting: Emergency Medicine

## 2021-01-23 DIAGNOSIS — K2970 Gastritis, unspecified, without bleeding: Secondary | ICD-10-CM | POA: Insufficient documentation

## 2021-01-23 DIAGNOSIS — R112 Nausea with vomiting, unspecified: Secondary | ICD-10-CM

## 2021-01-23 DIAGNOSIS — R000 Tachycardia, unspecified: Secondary | ICD-10-CM | POA: Insufficient documentation

## 2021-01-23 DIAGNOSIS — J45909 Unspecified asthma, uncomplicated: Secondary | ICD-10-CM | POA: Insufficient documentation

## 2021-01-23 DIAGNOSIS — K219 Gastro-esophageal reflux disease without esophagitis: Secondary | ICD-10-CM | POA: Insufficient documentation

## 2021-01-23 DIAGNOSIS — F17210 Nicotine dependence, cigarettes, uncomplicated: Secondary | ICD-10-CM | POA: Insufficient documentation

## 2021-01-23 LAB — CBC WITH DIFFERENTIAL/PLATELET
Abs Immature Granulocytes: 0.02 10*3/uL (ref 0.00–0.07)
Basophils Absolute: 0.1 10*3/uL (ref 0.0–0.1)
Basophils Relative: 1 %
Eosinophils Absolute: 0.3 10*3/uL (ref 0.0–0.5)
Eosinophils Relative: 4 %
HCT: 40 % (ref 36.0–46.0)
Hemoglobin: 13.3 g/dL (ref 12.0–15.0)
Immature Granulocytes: 0 %
Lymphocytes Relative: 22 %
Lymphs Abs: 1.7 10*3/uL (ref 0.7–4.0)
MCH: 31.6 pg (ref 26.0–34.0)
MCHC: 33.3 g/dL (ref 30.0–36.0)
MCV: 95 fL (ref 80.0–100.0)
Monocytes Absolute: 0.7 10*3/uL (ref 0.1–1.0)
Monocytes Relative: 9 %
Neutro Abs: 4.9 10*3/uL (ref 1.7–7.7)
Neutrophils Relative %: 64 %
Platelets: 278 10*3/uL (ref 150–400)
RBC: 4.21 MIL/uL (ref 3.87–5.11)
RDW: 13.8 % (ref 11.5–15.5)
WBC: 7.6 10*3/uL (ref 4.0–10.5)
nRBC: 0 % (ref 0.0–0.2)

## 2021-01-23 LAB — URINALYSIS, MICROSCOPIC (REFLEX): RBC / HPF: NONE SEEN RBC/hpf (ref 0–5)

## 2021-01-23 LAB — COMPREHENSIVE METABOLIC PANEL
ALT: 12 U/L (ref 0–44)
AST: 14 U/L — ABNORMAL LOW (ref 15–41)
Albumin: 3.8 g/dL (ref 3.5–5.0)
Alkaline Phosphatase: 68 U/L (ref 38–126)
Anion gap: 6 (ref 5–15)
BUN: 7 mg/dL (ref 6–20)
CO2: 26 mmol/L (ref 22–32)
Calcium: 9.1 mg/dL (ref 8.9–10.3)
Chloride: 104 mmol/L (ref 98–111)
Creatinine, Ser: 0.65 mg/dL (ref 0.44–1.00)
GFR, Estimated: >60 mL/min (ref >60)
Glucose, Bld: 91 mg/dL (ref 70–99)
Potassium: 4.1 mmol/L (ref 3.5–5.1)
Sodium: 136 mmol/L (ref 135–145)
Total Bilirubin: 0.2 mg/dL — ABNORMAL LOW (ref 0.3–1.2)
Total Protein: 6.8 g/dL (ref 6.5–8.1)

## 2021-01-23 LAB — URINALYSIS, ROUTINE W REFLEX MICROSCOPIC
Bilirubin Urine: NEGATIVE
Glucose, UA: NEGATIVE mg/dL
Hgb urine dipstick: NEGATIVE
Ketones, ur: NEGATIVE mg/dL
Nitrite: NEGATIVE
Protein, ur: NEGATIVE mg/dL
Specific Gravity, Urine: 1.02 (ref 1.005–1.030)
pH: 7.5 (ref 5.0–8.0)

## 2021-01-23 LAB — LIPASE, BLOOD: Lipase: 28 U/L (ref 11–51)

## 2021-01-23 LAB — PREGNANCY, URINE: Preg Test, Ur: NEGATIVE

## 2021-01-23 MED ORDER — FAMOTIDINE IN NACL 20-0.9 MG/50ML-% IV SOLN
20.0000 mg | Freq: Once | INTRAVENOUS | Status: AC
  Administered 2021-01-23: 13:00:00 20 mg via INTRAVENOUS
  Filled 2021-01-23: qty 50

## 2021-01-23 MED ORDER — SUCRALFATE 1 G PO TABS: 1.0000 g | ORAL_TABLET | Freq: Once | ORAL | Status: DC

## 2021-01-23 MED ORDER — SODIUM CHLORIDE 0.9 % IV BOLUS
1000.0000 mL | Freq: Once | INTRAVENOUS | Status: AC
  Administered 2021-01-23: 13:00:00 1000 mL via INTRAVENOUS

## 2021-01-23 MED ORDER — ONDANSETRON HCL 4 MG PO TABS: 4.0000 mg | ORAL_TABLET | Freq: Three times a day (TID) | ORAL | 0 refills | Status: DC | PRN

## 2021-01-23 NOTE — Discharge Instructions (Addendum)
You were seen in the emergency department for persistent nausea and vomiting.  You had lab work done that did not show any significant abnormalities.  Your symptoms improved somewhat with some medication.  I have put a referral into gastroenterology for you.  You can use over-the-counter acid medication such as Protonix.  Please limit or stop tobacco, alcohol, NSAIDs such as ibuprofen, marijuana as these things may be worsening her condition.  Maalox in between meals and at bedtime.  Return if any worsening or concerning symptoms.

## 2021-01-23 NOTE — ED Triage Notes (Signed)
Pt  c/o n/v x "years"-NAD-steady gait

## 2021-01-23 NOTE — ED Provider Notes (Signed)
[facility] EMERGENCY DEPARTMENT Provider Note   CSN: [id number] Arrival date & time: 01/23/21  1146     History Chief Complaint  Patient presents with   Vomiting    Gina Lynn is a 30 y.o. female.  She is here with a complaint of ongoing vomiting that is been going on for years.  She says it wakes her up at night she is nauseous and then throws up stomach acid.  Sometimes she is seeing some blood in the vomitus.  Has tried some over-the-counter Nexium without improvement.  She does smoke cigarettes, uses NSAIDs for her chronic back issues, and has sometimes used alcohol.  She does smoke marijuana frequently also.  She does not have a PCP or insurance and so has been unable to see GI.  She said she has been losing weight due to her vomiting.  The history is provided by the patient.  Emesis Severity:  Moderate Duration: years. Quality:  Stomach contents Progression:  Worsening Recent urination:  Normal Relieved by:  Nothing Worsened by:  Nothing Ineffective treatments:  None tried Associated symptoms: abdominal pain   Associated symptoms: no cough, no diarrhea, no fever, no headaches, no myalgias and no sore throat   Risk factors: not pregnant and no prior abdominal surgery       Past Medical History:  Diagnosis Date   Asthma    GERD (gastroesophageal reflux disease)    Sinusitis     Patient Active Problem List   Diagnosis Date Noted   Asthma, mild intermittent 08/10/2012    Past Surgical History:  Procedure Laterality Date   ADENOIDECTOMY     MOUTH SURGERY     NASAL SINUS SURGERY     TONSILLECTOMY       OB History     Gravida  1   Para      Term      Preterm      AB      Living         SAB      IAB      Ectopic      Multiple      Live Births              No family history on file.  Social History   Tobacco Use   Smoking status: Every Day    Packs/day: 1.00    Types: Cigarettes   Smokeless tobacco: Never  Vaping  Use   Vaping Use: Every day  Substance Use Topics   Alcohol use: Yes    Comment: occ   Drug use: Yes    Types: Marijuana    Home Medications Prior to Admission medications   Medication Sig Start Date End Date Taking? Authorizing Provider  albuterol (PROVENTIL HFA;VENTOLIN HFA) 108 (90 BASE) MCG/ACT inhaler Inhale into the lungs every 6 (six) hours as needed.    [provider]  cephALEXin (KEFLEX) 500 MG capsule Take 1 capsule (500 mg total) by mouth 4 (four) times daily. 01/16/14   Rancour, Jeannett Senior, MD  DiphenhydrAMINE HCl (BENADRYL ALLERGY PO) Take 2 tablets by mouth daily as needed. Patient used this medication for the swelling in her arm.    [provider]  predniSONE (DELTASONE) 20 MG tablet Take 40 mg by mouth daily for 3 days, then 20mg  by mouth daily for 3 days, then 10mg  daily for 3 days 01/10/16   , PA-C    Allergies    Patient has no known allergies.  Review of Systems   Review of Systems  Constitutional:  Negative for fever.  HENT:  Negative for sore throat.   Eyes:  Negative for visual disturbance.  Respiratory:  Negative for cough and shortness of breath.   Cardiovascular:  Negative for chest pain.  Gastrointestinal:  Positive for abdominal pain, nausea and vomiting. Negative for diarrhea.  Genitourinary:  Negative for dysuria.  Musculoskeletal:  Negative for myalgias.  Skin:  Negative for rash.  Neurological:  Negative for headaches.   Physical Exam Updated Vital Signs BP 132/81 (BP Location: Left Arm)    Pulse (!) 108    Temp 98.2 F (36.8 C) (Oral)    Resp 20    Ht 5\' 9"  (1.753 m)    Wt 68.5 kg    LMP 01/07/2021    SpO2 98%    BMI 22.30 kg/m   Physical Exam Vitals and nursing note reviewed.  Constitutional:      General: She is not in acute distress.    Appearance: Normal appearance. She is well-developed.  HENT:     Head: Normocephalic and atraumatic.  Eyes:     Conjunctiva/sclera: Conjunctivae normal.   Cardiovascular:     Rate and Rhythm: Regular rhythm. Tachycardia present.     Heart sounds: No murmur heard. Pulmonary:     Effort: Pulmonary effort is normal. No respiratory distress.     Breath sounds: Normal breath sounds.  Abdominal:     Palpations: Abdomen is soft.     Tenderness: There is no abdominal tenderness. There is no guarding or rebound.  Musculoskeletal:        General: No swelling.     Cervical back: Neck supple.  Skin:    General: Skin is warm and dry.     Capillary Refill: Capillary refill takes less than 2 seconds.  Neurological:     General: No focal deficit present.     Mental Status: She is alert.    ED Results / Procedures / Treatments   Labs (all labs ordered are listed, but only abnormal results are displayed) Labs Reviewed  COMPREHENSIVE METABOLIC PANEL - Abnormal; Notable for the following components:      Result Value   AST 14 (*)    Total Bilirubin 0.2 (*)    All other components within normal limits  URINALYSIS, ROUTINE W REFLEX MICROSCOPIC - Abnormal; Notable for the following components:   Leukocytes,Ua TRACE (*)    All other components within normal limits  URINALYSIS, MICROSCOPIC (REFLEX) - Abnormal; Notable for the following components:   Bacteria, UA RARE (*)    All other components within normal limits  LIPASE, BLOOD  CBC WITH DIFFERENTIAL/PLATELET  PREGNANCY, URINE    EKG None  Radiology No results found.  Procedures Procedures   Medications Ordered in ED Medications  sodium chloride 0.9 % bolus 1,000 mL (0 mLs Intravenous Stopped 01/23/21 1528)  famotidine (PEPCID) IVPB 20 mg premix (0 mg Intravenous Stopped 01/23/21 1409)    ED Course  I have reviewed the triage vital signs and the nursing notes.  Pertinent labs & imaging results that were available during my care of the patient were reviewed by me and considered in my medical decision making (see chart for details).    MDM Rules/Calculators/A&P                          This patient complains of nausea and vomiting, sometimes associated with some blood; this involves an extensive  number of treatment Options and is a complaint that carries with it a high risk of complications and Morbidity. The differential includes gastritis, peptic ulcer disease, reflux, obstruction, metabolic derangement, anemia, pregnancy  I ordered, reviewed and interpreted labs, which included CBC with normal white count normal hemoglobin, chemistries normal, LFTs unremarkable, urinalysis and pregnancy test negative I ordered medication IV fluids IV Pepcid  Previous records obtained and reviewed in epic, no recent visits for same  After the interventions stated above, I reevaluated the patient and found patient to be symptomatically improved.  Have placed referral to GI for her.  Prescription for Zofran, recommended to do over-the-counter PPI and Maalox.  Return instructions discussed, counseled on avoiding tobacco and NSAIDs alcohol and marijuana if she hopes to break the cycle of this     Final Clinical Impression(s) / ED Diagnoses Final diagnoses:  Nausea and vomiting, unspecified vomiting type  Gastritis, presence of bleeding unspecified, unspecified chronicity, unspecified gastritis type    Rx / DC Orders ED Discharge Orders          Ordered    Ambulatory referral to Gastroenterology        01/23/21 1424    ondansetron (ZOFRAN) 4 MG tablet  Every 8 hours PRN        01/23/21 1424             Hayden Rasmussen, MD 01/23/21 1722

## 2021-02-17 ENCOUNTER — Ambulatory Visit (INDEPENDENT_AMBULATORY_CARE_PROVIDER_SITE_OTHER): Payer: Self-pay | Admitting: Gastroenterology

## 2021-02-17 ENCOUNTER — Other Ambulatory Visit: Payer: Self-pay

## 2021-02-17 ENCOUNTER — Encounter: Payer: Self-pay | Admitting: Gastroenterology

## 2021-02-17 VITALS — BP 126/72 | HR 101 | Ht 69.0 in | Wt 157.1 lb

## 2021-02-17 DIAGNOSIS — R1013 Epigastric pain: Secondary | ICD-10-CM

## 2021-02-17 DIAGNOSIS — K219 Gastro-esophageal reflux disease without esophagitis: Secondary | ICD-10-CM

## 2021-02-17 DIAGNOSIS — R112 Nausea with vomiting, unspecified: Secondary | ICD-10-CM

## 2021-02-17 DIAGNOSIS — K920 Hematemesis: Secondary | ICD-10-CM

## 2021-02-17 MED ORDER — PANTOPRAZOLE SODIUM 40 MG PO TBEC: 40.0000 mg | DELAYED_RELEASE_TABLET | Freq: Two times a day (BID) | ORAL | 3 refills | Status: DC

## 2021-02-17 NOTE — Progress Notes (Signed)
Chief Complaint: Nausea/vomiting, hematemesis, epigastric pain   Referring Provider:     Terrilee Files, MD    HPI:     Gina Lynn is a 31 y.o. female referred to the Gastroenterology Clinic for evaluation of nausea/vomiting and hematemesis.   Reports having nausea/vomiting for years with episodic blood-tinged emesis.  Sxs worsenign over last 1.5 years or so. Started with intermittent hematemesis in 07/2020. Has trialed OTC Nexium without improvement.  Does smoke cigarettes and frequent marijuana.  Uses NSAIDs frequently for chronic back pain. She was seen in the ER on 01/23/2021 for this issue. - Hemodynamically stable - Normal CBC, CMP, lipase, UA, hCG - Treated with IV fluids, IV Pepcid with improvement and discharged home with Zofran, OTC PPI, Maalox with GI referral  Today, she states she continues to have nausea/vomiting along with epigastric pain. Will wake in the middle of the night with HB, regurgitation, then will have N/V. Feels better after emesis. No dysphagia. Has lost ~10# over last few months due to avoiding foods for fear of nocturnal sxs. Tends to be sxs free if no dinner. Not much response to prn Zofran. Taking Nexium 20 mg OTC QAM with some improvement.   Still smoking 2-3 blunts/day for the last 5-6 years or so.    No previous EGD or colonoscopy.  No recent abdominal imaging for review.   Past Medical History:  Diagnosis Date   Asthma    GERD (gastroesophageal reflux disease)    Sinusitis      Past Surgical History:  Procedure Laterality Date   ADENOIDECTOMY     MOUTH SURGERY     NASAL SINUS SURGERY     TONSILLECTOMY     Family History  Problem Relation Age of Onset   Colon cancer Paternal Uncle    Lung cancer Paternal Aunt    Esophageal cancer Neg Hx    Social History   Tobacco Use   Smoking status: Every Day    Packs/day: 1.00    Types: Cigarettes   Smokeless tobacco: Never  Vaping Use   Vaping Use: Every day  Substance  Use Topics   Alcohol use: Yes    Comment: occ   Drug use: Yes    Types: Marijuana   Current Outpatient Medications  Medication Sig Dispense Refill   albuterol (PROVENTIL HFA;VENTOLIN HFA) 108 (90 BASE) MCG/ACT inhaler Inhale into the lungs every 6 (six) hours as needed.     DiphenhydrAMINE HCl (BENADRYL ALLERGY PO) Take 2 tablets by mouth daily as needed. Patient used this medication for the swelling in her arm.     Multiple Vitamin (MULTIVITAMIN) tablet Take 1 tablet by mouth daily.     ondansetron (ZOFRAN) 4 MG tablet Take 1 tablet (4 mg total) by mouth every 8 (eight) hours as needed for nausea or vomiting. 15 tablet 0   No current facility-administered medications for this visit.   No Known Allergies   Review of Systems: All systems reviewed and negative except where noted in HPI.     Physical Exam:    Wt Readings from Last 3 Encounters:  02/17/21 157 lb 2 oz (71.3 kg)  01/23/21 151 lb (68.5 kg)  01/10/16 150 lb (68 kg)    BP 126/72    Pulse (!) 101    Ht 5\' 9"  (1.753 m)    Wt 157 lb 2 oz (71.3 kg)    BMI 23.20 kg/m  Constitutional:  Pleasant, in no acute distress. Psychiatric: Normal mood and affect. Behavior is normal. Cardiovascular: Normal rate, regular rhythm. No edema Pulmonary/chest: Effort normal and breath sounds normal. No wheezing, rales or rhonchi. Abdominal: Soft, nondistended, nontender. Bowel sounds active throughout. There are no masses palpable. No hepatomegaly. Neurological: Alert and oriented to person place and time. Skin: Skin is warm and dry. No rashes noted.   ASSESSMENT AND PLAN;   1) Nausea/Vomiting 2) Hematemesis 3) Epigastric pain  Discussed DDx to include PUD, gastritis, cannabinoid hyperemesis, GOO, etc. with plan for the following:  - EGD to evaluate for mucosal/luminal pathology - Protonix 40 mg PO BID - Stop Nexium when starting Protonix - 6 small meals/day - Stop marijuana - Quit smoking  4) Heartburn - Evaluate for  erosive esophagitis, hiatal hernia, LES laxity time EGD as above - Trial course of high-dose PPI as above - Avoid exacerbating foods - If above unrevealing and no response to therapy, plan for GES  The indications, risks, and benefits of EGD were explained to the patient in detail. Risks include but are not limited to bleeding, perforation, adverse reaction to medications, and cardiopulmonary compromise. Sequelae include but are not limited to the possibility of surgery, hospitalization, and mortality. The patient verbalized understanding and wished to proceed. All questions answered, referred to scheduler. Further recommendations pending results of the exam.      Shellia Cleverly, DO, FACG  02/17/2021, 8:31 AM   Terrilee Files, MD

## 2021-02-17 NOTE — Patient Instructions (Signed)
If you are age 30 or younger, your body mass index should be between 19-25. Your There is no height or weight on file to calculate BMI. If this is out of the aformentioned range listed, please consider follow up with your Primary Care Provider.   __________________________________________________________  The [facility] providers would like to encourage you to use [facility] to communicate with providers for non-urgent requests or questions.  Due to long hold times on the telephone, sending your provider a message by [facility] may be a faster and more efficient way to get a response.  Please allow 48 business hours for a response.  Please remember that this is for non-urgent requests.   Due to recent changes in healthcare laws, you may see the results of your imaging and laboratory studies on MyChart before your provider has had a chance to review them.  We understand that in some cases there may be results that are confusing or concerning to you. Not all laboratory results come back in the same time frame and the provider may be waiting for multiple results in order to interpret others.  Please give [place] 48 hours in order for your provider to thoroughly review all the results before contacting the office for clarification of your results.   We have sent the following medications to your pharmacy for you to pick up at your convenience: Protonix  It has been recommended to you by your physician that you have a(n) Endoscopy completed. Per your request, we did not schedule the procedure(s) today. Please contact our office at [phone] should you decide to have the procedure completed. You will be scheduled for a pre-visit and procedure at that time.   We've provided you to complete paperwork for [facility] Patient Assistance.  Thank you for choosing me and [facility].  Vito Cirigliano, D.O.

## 2021-12-22 ENCOUNTER — Emergency Department (HOSPITAL_BASED_OUTPATIENT_CLINIC_OR_DEPARTMENT_OTHER): Payer: Self-pay

## 2021-12-22 ENCOUNTER — Other Ambulatory Visit: Payer: Self-pay

## 2021-12-22 ENCOUNTER — Emergency Department (HOSPITAL_BASED_OUTPATIENT_CLINIC_OR_DEPARTMENT_OTHER)
Admission: EM | Admit: 2021-12-22 | Discharge: 2021-12-22 | Disposition: A | Discharge status: Home / Self Care | Payer: Self-pay | Attending: Emergency Medicine | Admitting: Emergency Medicine

## 2021-12-22 ENCOUNTER — Encounter (HOSPITAL_BASED_OUTPATIENT_CLINIC_OR_DEPARTMENT_OTHER): Payer: Self-pay | Admitting: Urology

## 2021-12-22 ENCOUNTER — Other Ambulatory Visit (HOSPITAL_BASED_OUTPATIENT_CLINIC_OR_DEPARTMENT_OTHER): Payer: Self-pay

## 2021-12-22 DIAGNOSIS — M545 Low back pain, unspecified: Secondary | ICD-10-CM | POA: Insufficient documentation

## 2021-12-22 DIAGNOSIS — N3000 Acute cystitis without hematuria: Secondary | ICD-10-CM | POA: Insufficient documentation

## 2021-12-22 LAB — URINALYSIS, ROUTINE W REFLEX MICROSCOPIC
Bilirubin Urine: NEGATIVE
Glucose, UA: NEGATIVE mg/dL
Hgb urine dipstick: NEGATIVE
Ketones, ur: NEGATIVE mg/dL
Nitrite: POSITIVE — AB
Protein, ur: NEGATIVE mg/dL
Specific Gravity, Urine: 1.02 (ref 1.005–1.030)
pH: 6 (ref 5.0–8.0)

## 2021-12-22 LAB — URINALYSIS, MICROSCOPIC (REFLEX): WBC, UA: >50 WBC/hpf (ref 0–5)

## 2021-12-22 LAB — PREGNANCY, URINE: Preg Test, Ur: NEGATIVE

## 2021-12-22 MED ORDER — METHYLPREDNISOLONE 4 MG PO TBPK
ORAL_TABLET | ORAL | 0 refills | Status: DC
  Filled 2021-12-22: qty 21, 6d supply, fill #0

## 2021-12-22 MED ORDER — CYCLOBENZAPRINE HCL 10 MG PO TABS
10.0000 mg | ORAL_TABLET | Freq: Two times a day (BID) | ORAL | 0 refills | Status: DC | PRN
  Filled 2021-12-22: qty 20, 10d supply, fill #0

## 2021-12-22 MED ORDER — KETOROLAC TROMETHAMINE 60 MG/2ML IM SOLN
60.0000 mg | Freq: Once | INTRAMUSCULAR | Status: AC
  Administered 2021-12-22: 60 mg via INTRAMUSCULAR
  Filled 2021-12-22: qty 2

## 2021-12-22 MED ORDER — CEPHALEXIN 500 MG PO CAPS
500.0000 mg | ORAL_CAPSULE | Freq: Two times a day (BID) | ORAL | 0 refills | Status: AC
  Filled 2021-12-22: qty 10, 5d supply, fill #0

## 2021-12-22 MED ORDER — OXYCODONE HCL 5 MG PO TABS
5.0000 mg | ORAL_TABLET | Freq: Four times a day (QID) | ORAL | 0 refills | Status: DC | PRN
  Filled 2021-12-22: qty 10, 3d supply, fill #0

## 2021-12-22 NOTE — ED Triage Notes (Signed)
Pt states lower back pain x 3 weeks  Worsening over past week , does a lot of heavy lifting at work  Pain worse with movement  H/o DDD and scoliosis   Also states noticed blood in vomit this am, vomit x3 this am from pain

## 2021-12-22 NOTE — ED Provider Notes (Signed)
[facility] EMERGENCY DEPARTMENT Provider Note   CSN: [id number] Arrival date & time: 12/22/21  1028     History  Chief Complaint  Patient presents with   Back Pain    Gina Lynn is a 31 y.o. female.  Patient here with ongoing left lower back pain.  History of the same.  Sciatic type pain going into her left thigh.  Nothing makes it worse or better.  Denies any abdominal pain or flank pain or pain with urination.  Nothing makes it worse or better.  She takes ibuprofen chronically.  She denies any black stools or bloody stools.  Denies any issues going to the bathroom.  Denies any chest pain or shortness of breath.  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  The history is provided by the patient.       Home Medications Prior to Admission medications   Medication Sig Start Date End Date Taking? Authorizing Provider  cephALEXin (KEFLEX) 500 MG capsule Take 1 capsule (500 mg total) by mouth 2 (two) times daily for 5 days. 12/22/21 12/27/21 Yes Lynn Sissel, DO  cyclobenzaprine (FLEXERIL) 10 MG tablet Take 1 tablet (10 mg total) by mouth 2 (two) times daily as needed for muscle spasms. 12/22/21  Yes Endy Easterly, DO  methylPREDNISolone (MEDROL DOSEPAK) 4 MG TBPK tablet Follow package insert 12/22/21  Yes Fraya Ueda, DO  oxyCODONE (ROXICODONE) 5 MG immediate release tablet Take 1 tablet (5 mg total) by mouth every 6 (six) hours as needed for up to 10 doses for breakthrough pain. 12/22/21  Yes Nykiah Ma, DO  albuterol (PROVENTIL HFA;VENTOLIN HFA) 108 (90 BASE) MCG/ACT inhaler Inhale into the lungs every 6 (six) hours as needed.    [provider]  DiphenhydrAMINE HCl (BENADRYL ALLERGY PO) Take 2 tablets by mouth daily as needed. Patient used this medication for the swelling in her arm.    [provider]  Multiple Vitamin (MULTIVITAMIN) tablet Take 1 tablet by mouth daily.    [provider]  ondansetron (ZOFRAN) 4 MG tablet  Take 1 tablet (4 mg total) by mouth every 8 (eight) hours as needed for nausea or vomiting. 01/23/21   Terrilee Files, MD  pantoprazole (PROTONIX) 40 MG tablet Take 1 tablet (40 mg total) by mouth 2 (two) times daily. 02/17/21   Cirigliano, Vito V, DO      Allergies    Patient has no known allergies.    Review of Systems   Review of Systems  Physical Exam Updated Vital Signs BP (!) 144/94 (BP Location: Right Arm)   Pulse 92   Temp 98.5 F (36.9 C) (Oral)   Resp 18   Ht 5\' 9"  (1.753 m)   Wt 71.3 kg   LMP 12/15/2021 (Approximate)   SpO2 100%   BMI 23.21 kg/m  Physical Exam Vitals and nursing note reviewed.  Constitutional:      General: She is not in acute distress.    Appearance: She is well-developed.  HENT:     Head: Normocephalic and atraumatic.  Eyes:     Extraocular Movements: Extraocular movements intact.     Conjunctiva/sclera: Conjunctivae normal.     Pupils: Pupils are equal, round, and reactive to light.  Cardiovascular:     Rate and Rhythm: Normal rate and regular rhythm.     Pulses: Normal pulses.     Heart sounds: Normal heart sounds. No murmur heard. Pulmonary:     Effort: Pulmonary effort is normal. No respiratory distress.  Breath sounds: Normal breath sounds.  Abdominal:     Palpations: Abdomen is soft.     Tenderness: There is no abdominal tenderness.  Musculoskeletal:        General: Tenderness present. No swelling. Normal range of motion.     Cervical back: Normal range of motion and neck supple.     Comments: Tenderness to the paraspinal lumbar muscles on the left, no midline spinal pain  Skin:    General: Skin is warm and dry.     Capillary Refill: Capillary refill takes less than 2 seconds.  Neurological:     General: No focal deficit present.     Mental Status: She is alert and oriented to person, place, and time.     Cranial Nerves: No cranial nerve deficit.     Sensory: No sensory deficit.     Motor: No weakness.     Coordination:  Coordination normal.     Comments: 5+ out of 5 strength throughout, normal sensation  Psychiatric:        Mood and Affect: Mood normal.     ED Results / Procedures / Treatments   Labs (all labs ordered are listed, but only abnormal results are displayed) Labs Reviewed  URINALYSIS, ROUTINE W REFLEX MICROSCOPIC - Abnormal; Notable for the following components:      Result Value   Nitrite POSITIVE (*)    Leukocytes,Ua MODERATE (*)    All other components within normal limits  URINALYSIS, MICROSCOPIC (REFLEX) - Abnormal; Notable for the following components:   Bacteria, UA MANY (*)    All other components within normal limits  PREGNANCY, URINE    EKG None  Radiology DG Lumbar Spine Complete  Result Date: 12/22/2021 CLINICAL DATA:  Low back pain over the last 2 days. EXAM: LUMBAR SPINE - COMPLETE 4+ VIEW COMPARISON:  None Available. FINDINGS: Five lumbar type vertebral bodies. No significant malalignment. Chronic disc space narrowing at L5-S1 and to a lesser extent at L4-5. L4-5 and L5-S1 facet osteoarthritis. The findings could certainly relate to regional pain. Sacroiliac joints appear normal. IMPRESSION: Chronic disc space narrowing at L5-S1 and to a lesser extent at L4-5. Lower lumbar facet osteoarthritis. Electronically Signed   By: Paulina Fusi M.D.   On: 12/22/2021 11:12    Procedures Procedures    Medications Ordered in ED Medications  ketorolac (TORADOL) injection 60 mg (has no administration in time range)    ED Course/ Medical Decision Making/ A&P                           Medical Decision Making Amount and/or Complexity of Data Reviewed Radiology: ordered.   Gina Lynn is here with left lower back pain.  History of chronic back pain.  Normal vitals.  No fever.  Pain mostly in the left SI joint left lower lumbar area.  Differential diagnosis likely sciatic type pain.  She denies any trauma.  Denies any pain with urination.  Urinalysis and pregnancy test and  x-ray of the lower back have already been ordered prior to my evaluation.  Per my review urinalysis does appear consistent with infection but she denies any symptoms however we will treat with antibiotics.  She has no history of kidney stones.  There is no blood in the urine and overall doubt kidney stone.  She has normal vitals.  No fever.  Symptoms are consistent with sciatica and likely some nerve irritation.  X-ray of the lumbar spine per  my review and interpretation shows no fracture or malalignment.  Overall we will treat with Medrol Dosepak, Keflex and have her follow-up with primary care doctor.  Given a Toradol shot and discharged.  She has no symptoms to suggest cauda equina or other acute cord compression.  This chart was dictated using voice recognition software.  Despite best efforts to proofread,  errors can occur which can change the documentation meaning.            Final Clinical Impression(s) / ED Diagnoses Final diagnoses:  Acute low back pain, unspecified back pain laterality, unspecified whether sciatica present  Acute cystitis without hematuria    Rx / DC Orders ED Discharge Orders          Ordered    cephALEXin (KEFLEX) 500 MG capsule  2 times daily        12/22/21 1515    oxyCODONE (ROXICODONE) 5 MG immediate release tablet  Every 6 hours PRN        12/22/21 1515    methylPREDNISolone (MEDROL DOSEPAK) 4 MG TBPK tablet        12/22/21 1515    cyclobenzaprine (FLEXERIL) 10 MG tablet  2 times daily PRN        12/22/21 1515              Kirbi Farrugia, DO 12/22/21 1516

## 2021-12-22 NOTE — Discharge Instructions (Signed)
Take medications as prescribed.  Please be careful with Flexeril and oxycodone use as these medications are mildly sedating.  Do not drive or do any other dangerous activities.

## 2022-09-09 ENCOUNTER — Other Ambulatory Visit: Payer: Self-pay

## 2022-09-09 ENCOUNTER — Other Ambulatory Visit (HOSPITAL_BASED_OUTPATIENT_CLINIC_OR_DEPARTMENT_OTHER): Payer: Self-pay

## 2022-09-09 ENCOUNTER — Emergency Department (HOSPITAL_BASED_OUTPATIENT_CLINIC_OR_DEPARTMENT_OTHER)
Admission: EM | Admit: 2022-09-09 | Discharge: 2022-09-09 | Disposition: A | Discharge status: Home / Self Care | Payer: Self-pay | Attending: Emergency Medicine | Admitting: Emergency Medicine

## 2022-09-09 DIAGNOSIS — J45909 Unspecified asthma, uncomplicated: Secondary | ICD-10-CM | POA: Insufficient documentation

## 2022-09-09 DIAGNOSIS — K0381 Cracked tooth: Secondary | ICD-10-CM | POA: Insufficient documentation

## 2022-09-09 DIAGNOSIS — K047 Periapical abscess without sinus: Secondary | ICD-10-CM | POA: Insufficient documentation

## 2022-09-09 DIAGNOSIS — K029 Dental caries, unspecified: Secondary | ICD-10-CM

## 2022-09-09 DIAGNOSIS — K0889 Other specified disorders of teeth and supporting structures: Secondary | ICD-10-CM

## 2022-09-09 MED ORDER — OXYCODONE-ACETAMINOPHEN 5-325 MG PO TABS
1.0000 | ORAL_TABLET | Freq: Once | ORAL | Status: AC
  Administered 2022-09-09: 1 via ORAL
  Filled 2022-09-09: qty 1

## 2022-09-09 MED ORDER — AMOXICILLIN-POT CLAVULANATE 875-125 MG PO TABS
1.0000 | ORAL_TABLET | Freq: Two times a day (BID) | ORAL | 0 refills | Status: DC
  Filled 2022-09-09: qty 14, 7d supply, fill #0

## 2022-09-09 NOTE — Discharge Instructions (Addendum)
You were seen in the emergency department for dental pain.  As we discussed I think your pain is likely related to an infection of your back right molar. We normally treat this with anti-inflammatories and antibiotics.   I've attached a resource guide with several dentists in the area. It's incredibly important you follow up with a dentist as soon as possible for definitive treatment.   Continue to monitor how you're doing and return to the ER for new or worsening symptoms such as difficulty swallowing your own saliva, difficulty breathing, or fever.

## 2022-09-09 NOTE — ED Provider Notes (Signed)
[place] EMERGENCY DEPARTMENT AT [facility] Provider Note   CSN: [id number] Arrival date & time: 09/09/22  1329     History  Chief Complaint  Patient presents with   Dental Pain    Gina Lynn is a 32 y.o. female with history of asthma, GERD, sinusitis, who presents to the emergency department complaining of right lower dental pain for the past 2 weeks.  Patient states that she has had a broken tooth which she was told was one of her wisdom teeth over a year ago, and intermittently has problems with it.  For the past 2 weeks she has had worsening pain and some difficulty opening her mouth.  Does not believe she has had a fever.  Tolerating her own secretions.  Was trying ibuprofen with some relief.  She was told she needed to get her wisdom teeth out, but she does not have insurance to do this.   Dental Pain      Home Medications Prior to Admission medications   Medication Sig Start Date End Date Taking? Authorizing Provider  amoxicillin-clavulanate (AUGMENTIN) 875-125 MG tablet Take 1 tablet by mouth every 12 (twelve) hours. 09/09/22  Yes Kamaile Zachow T, PA-C  albuterol (PROVENTIL HFA;VENTOLIN HFA) 108 (90 BASE) MCG/ACT inhaler Inhale into the lungs every 6 (six) hours as needed.    [provider]  cyclobenzaprine (FLEXERIL) 10 MG tablet Take 1 tablet (10 mg total) by mouth 2 (two) times daily as needed for muscle spasms. 12/22/21   Curatolo, Adam, DO  DiphenhydrAMINE HCl (BENADRYL ALLERGY PO) Take 2 tablets by mouth daily as needed. Patient used this medication for the swelling in her arm.    [provider]  methylPREDNISolone (MEDROL DOSEPAK) 4 MG TBPK tablet Follow package insert 12/22/21   Curatolo, Adam, DO  Multiple Vitamin (MULTIVITAMIN) tablet Take 1 tablet by mouth daily.    [provider]  ondansetron (ZOFRAN) 4 MG tablet Take 1 tablet (4 mg total) by mouth every 8 (eight) hours as needed for nausea or vomiting. 01/23/21    Terrilee Files, MD  oxyCODONE (ROXICODONE) 5 MG immediate release tablet Take 1 tablet (5 mg total) by mouth every 6 (six) hours as needed for up to 10 doses for breakthrough pain. 12/22/21   Curatolo, Adam, DO  pantoprazole (PROTONIX) 40 MG tablet Take 1 tablet (40 mg total) by mouth 2 (two) times daily. 02/17/21   Cirigliano, Vito V, DO      Allergies    Patient has no known allergies.    Review of Systems   Review of Systems  HENT:  Positive for dental problem.   All other systems reviewed and are negative.   Physical Exam Updated Vital Signs BP (!) 141/86 (BP Location: Left Arm)   Pulse 82   Temp 97.7 F (36.5 C)   Resp 18   Ht 5\' 9"  (1.753 m)   Wt 70.3 kg   SpO2 97%   BMI 22.89 kg/m  Physical Exam Vitals and nursing note reviewed.  Constitutional:      Appearance: Normal appearance.  HENT:     Head: Normocephalic and atraumatic.     Mouth/Throat:     Lips: Pink.     Mouth: Mucous membranes are moist.     Dentition: Dental caries and dental abscesses present.     Pharynx: Oropharynx is clear. Uvula midline.   Eyes:     Conjunctiva/sclera: Conjunctivae normal.  Pulmonary:     Effort: Pulmonary effort is normal.  No respiratory distress.  Skin:    General: Skin is warm and dry.  Neurological:     Mental Status: She is alert.  Psychiatric:        Mood and Affect: Mood normal.        Behavior: Behavior normal.     ED Results / Procedures / Treatments   Labs (all labs ordered are listed, but only abnormal results are displayed) Labs Reviewed - No data to display  EKG None  Radiology No results found.  Procedures Procedures    Medications Ordered in ED Medications  oxyCODONE-acetaminophen (PERCOCET/ROXICET) 5-325 MG per tablet 1 tablet (has no administration in time range)    ED Course/ Medical Decision Making/ A&P                                 Medical Decision Making Risk Prescription drug management.   This patient is a 32 y.o.  female who presents to the ED for concern of dental problem.   Differential diagnoses prior to evaluation: Dental abscess, dental caries, dental fracture, PTA, ludwig's angina  Past Medical History / Social History / Additional history: Chart reviewed. Pertinent results include: Asthma, GERD, sinusitis  Physical Exam: Physical exam performed. The pertinent findings include: Normal vital signs, no acute distress.  Multiple dental caries, with periapical abscess to the right lower molar.  No sublingual or submandibular swelling.  No evidence of PTA.  Disposition: After consideration of the diagnostic results and the patients response to treatment, I feel that emergency department workup does not suggest an emergent condition requiring admission or immediate intervention beyond what has been performed at this time. The plan is: discharge to home with antibiotics, encouraged anti-inflammatories. Given Designer, jewellery. The patient is safe for discharge and has been instructed to return immediately for worsening symptoms, change in symptoms or any other concerns.   Final Clinical Impression(s) / ED Diagnoses Final diagnoses:  Pain, dental  Infected dental caries    Rx / DC Orders ED Discharge Orders          Ordered    amoxicillin-clavulanate (AUGMENTIN) 875-125 MG tablet  Every 12 hours        09/09/22 1456           Portions of this report may have been transcribed using voice recognition software. Every effort was made to ensure accuracy; however, inadvertent computerized transcription errors may be present.    Jeanella Flattery 09/09/22 1500    Jacalyn Lefevre, MD 09/10/22 1038

## 2022-09-09 NOTE — ED Triage Notes (Signed)
C/O right sided dental pain x 2 weeks

## 2022-10-10 ENCOUNTER — Emergency Department (HOSPITAL_BASED_OUTPATIENT_CLINIC_OR_DEPARTMENT_OTHER)
Admission: EM | Admit: 2022-10-10 | Discharge: 2022-10-10 | Disposition: A | Discharge status: Home / Self Care | Payer: Self-pay | Attending: Emergency Medicine | Admitting: Emergency Medicine

## 2022-10-10 ENCOUNTER — Other Ambulatory Visit: Payer: Self-pay

## 2022-10-10 DIAGNOSIS — R0981 Nasal congestion: Secondary | ICD-10-CM

## 2022-10-10 DIAGNOSIS — F17210 Nicotine dependence, cigarettes, uncomplicated: Secondary | ICD-10-CM | POA: Insufficient documentation

## 2022-10-10 DIAGNOSIS — U071 COVID-19: Secondary | ICD-10-CM | POA: Insufficient documentation

## 2022-10-10 DIAGNOSIS — R051 Acute cough: Secondary | ICD-10-CM

## 2022-10-10 DIAGNOSIS — M791 Myalgia, unspecified site: Secondary | ICD-10-CM

## 2022-10-10 DIAGNOSIS — J45901 Unspecified asthma with (acute) exacerbation: Secondary | ICD-10-CM | POA: Insufficient documentation

## 2022-10-10 MED ORDER — PREDNISONE 20 MG PO TABS: 40.0000 mg | ORAL_TABLET | Freq: Every day | ORAL | 0 refills | Status: AC

## 2022-10-10 MED ORDER — ALBUTEROL SULFATE HFA 108 (90 BASE) MCG/ACT IN AERS: 1.0000 | INHALATION_SPRAY | Freq: Four times a day (QID) | RESPIRATORY_TRACT | 0 refills | Status: AC | PRN

## 2022-10-10 MED ORDER — IBUPROFEN 600 MG PO TABS: 600.0000 mg | ORAL_TABLET | Freq: Four times a day (QID) | ORAL | 0 refills | Status: DC | PRN

## 2022-10-10 MED ORDER — PAXLOVID (300/100) 20 X 150 MG & 10 X 100MG PO TBPK: 3.0000 | ORAL_TABLET | Freq: Two times a day (BID) | ORAL | 0 refills | Status: AC

## 2022-10-10 MED ORDER — FLUTICASONE PROPIONATE 50 MCG/ACT NA SUSP: 2.0000 | Freq: Every day | NASAL | 2 refills | Status: AC

## 2022-10-10 MED ORDER — ALBUTEROL SULFATE HFA 108 (90 BASE) MCG/ACT IN AERS
1.0000 | INHALATION_SPRAY | Freq: Once | RESPIRATORY_TRACT | Status: AC
  Administered 2022-10-10: 2 via RESPIRATORY_TRACT
  Filled 2022-10-10: qty 6.7

## 2022-10-10 MED ORDER — AEROCHAMBER PLUS FLO-VU LARGE MISC
1.0000 | Freq: Once | Status: AC
Start: 2022-10-10 — End: 2022-10-10
  Administered 2022-10-10: 1
  Filled 2022-10-10: qty 1

## 2022-10-10 NOTE — Discharge Instructions (Addendum)
As discussed, symptoms are likely secondary to COVID.  Will treat breathing with albuterol as well as prednisone given concern for asthma exacerbation.  Will also send in antiviral called Paxlovid to take over the next 5 days.  Recommend Tylenol/Motrin for pain/fever, nasal steroid spray, allergy medicine such as Zyrtec/patient/Allegra recommend follow-up with your primary care for reassessment of your symptoms.  Please do not hesitate to return to emergency department for worrisome signs and symptoms we discussed become apparent.

## 2022-10-10 NOTE — ED Triage Notes (Signed)
Patient presents to ED via POV from work. Tested positive for COVID today. Here with cough and generalized fatigue.

## 2022-10-10 NOTE — ED Provider Notes (Signed)
[place] EMERGENCY DEPARTMENT AT [facility] Provider Note   CSN: [id number] Arrival date & time: 10/10/22  1537     History  Chief Complaint  Patient presents with   Covid Positive    Gina Lynn is a 32 y.o. female.  HPI   32 year old female presents emergency department with complaints of cough, generalized fatigue, nasal congestion, positive at-home COVID test.  Patient states that she has been experiencing symptoms for the past couple of days.  States that employee at work tested positive for COVID as she was concerned about the same.  States she took multiple COVID test which were all positive today.  Presents emergency department due to feelings of shortness of breath.  Patient states she has a history of asthma whenever she gets a viral infection, it "flares up."  States that the only inhaler that she has 31 years old.  States she has used inhaler did note improvement of breathing but is requesting albuterol refill.  Denies any chest pain, abdominal pain, nausea, vomiting.  Past medical history significant for asthma, GERD, status  Home Medications Prior to Admission medications   Medication Sig Start Date End Date Taking? Authorizing Provider  albuterol (VENTOLIN HFA) 108 (90 Base) MCG/ACT inhaler Inhale 1-2 puffs into the lungs every 6 (six) hours as needed for wheezing or shortness of breath. 10/10/22  Yes Sherian Maroon A, PA  fluticasone (FLONASE) 50 MCG/ACT nasal spray Place 2 sprays into both nostrils daily. 10/10/22  Yes Sherian Maroon A, PA  ibuprofen (ADVIL) 600 MG tablet Take 1 tablet (600 mg total) by mouth every 6 (six) hours as needed. 10/10/22  Yes Sherian Maroon A, PA  nirmatrelvir & ritonavir (PAXLOVID, 300/100,) 20 x 150 MG & 10 x 100MG  TBPK Take 3 tablets by mouth 2 (two) times daily for 5 days. 10/10/22 10/15/22 Yes Sherian Maroon A, PA  predniSONE (DELTASONE) 20 MG tablet Take 2 tablets (40 mg total) by mouth daily with breakfast for 5 days.  10/10/22 10/15/22 Yes Sherian Maroon A, PA  albuterol (PROVENTIL HFA;VENTOLIN HFA) 108 (90 BASE) MCG/ACT inhaler Inhale into the lungs every 6 (six) hours as needed.    [provider]  DiphenhydrAMINE HCl (BENADRYL ALLERGY PO) Take 2 tablets by mouth daily as needed. Patient used this medication for the swelling in her arm.    [provider]  Multiple Vitamin (MULTIVITAMIN) tablet Take 1 tablet by mouth daily.    [provider]  pantoprazole (PROTONIX) 40 MG tablet Take 1 tablet (40 mg total) by mouth 2 (two) times daily. 02/17/21   Cirigliano, Vito V, DO      Allergies    Patient has no known allergies.    Review of Systems   Review of Systems  All other systems reviewed and are negative.   Physical Exam Updated Vital Signs BP (!) 147/87   Pulse 88   Temp 98.3 F (36.8 C) (Oral)   Resp (!) 22   Ht 5\' 9"  (1.753 m)   Wt 68 kg   SpO2 97%   BMI 22.15 kg/m  Physical Exam Vitals and nursing note reviewed.  Constitutional:      General: She is not in acute distress.    Appearance: She is well-developed.  HENT:     Head: Normocephalic and atraumatic.  Eyes:     Conjunctiva/sclera: Conjunctivae normal.  Cardiovascular:     Rate and Rhythm: Normal rate and regular rhythm.     Heart sounds: No murmur heard. Pulmonary:  Effort: Pulmonary effort is normal. No respiratory distress.     Comments: Mild wheeze auscultated bilaterally. Abdominal:     Palpations: Abdomen is soft.     Tenderness: There is no abdominal tenderness. There is no guarding.  Musculoskeletal:        General: No swelling.     Cervical back: Neck supple.     Right lower leg: No edema.     Left lower leg: No edema.  Skin:    General: Skin is warm and dry.     Capillary Refill: Capillary refill takes less than 2 seconds.  Neurological:     Mental Status: She is alert.  Psychiatric:        Mood and Affect: Mood normal.     ED Results / Procedures / Treatments   Labs (all  labs ordered are listed, but only abnormal results are displayed) Labs Reviewed - No data to display  EKG None  Radiology No results found.  Procedures Procedures    Medications Ordered in ED Medications  albuterol (VENTOLIN HFA) 108 (90 Base) MCG/ACT inhaler 1-2 puff (2 puffs Inhalation Given 10/10/22 1602)  AeroChamber Plus Flo-Vu Large MISC 1 each (1 each Other Given 10/10/22 1602)    ED Course/ Medical Decision Making/ A&P                                 Medical Decision Making Risk Prescription drug management.   This patient presents to the ED for concern of positive COVID test, cough, congestion, this involves an extensive number of treatment options, and is a complaint that carries with it a high risk of complications and morbidity.  The differential diagnosis includes COVID, influenza, RSV, asthma exacerbation, PE, pneumothorax, pneumonia   Co morbidities that complicate the patient evaluation  See HPI   Additional history obtained:  Additional history obtained from EMR External records from outside source obtained and reviewed including hospital records   Lab Tests:  N/a   Imaging Studies ordered:  N/a   Cardiac Monitoring: / EKG:  The patient was maintained on a cardiac monitor.  I personally viewed and interpreted the cardiac monitored which showed an underlying rhythm of: Sinus rhythm   Consultations Obtained:  N/a   Problem List / ED Course / Critical interventions / Medication management  Cough, congestion, body aches, COVID, asthma exacerbation I ordered medication including albuterol   Reevaluation of the patient after these medicines showed that the patient improved I have reviewed the patients home medicines and have made adjustments as needed   Social Determinants of Health:  Chronic cigarette use.  Chronic marijuana use.   Test / Admission - Considered:  Cough, congestion, body aches, COVID, asthma exacerbation Vitals  signs significant for hypertension with pressure 147/87. Otherwise within normal range and stable throughout visit. 32 year old female presents emergency department with history of cough, congestion, body aches since yesterday.  Patient with positive at-home COVID testing which is most likely causing symptoms.  Patient with mild complaints of shortness of breath of which significantly proved with albuterol in the outpatient setting.  Patient with mild wheeze on exam with again, improvement with albuterol inhaler.  Suspect patient's slight breathing difficulties likely related to asthma exacerbation on top of current COVID.  Will treat with albuterol and short course of prednisone.  Recommend symptomatic therapy as described and will be as.  Lower suspicion for PE.  Low clinical suspicion for pneumonia.  Recommend close follow-up with primary care in the outpatient setting.  Treatment plan discussed at length with patient and she acknowledged understanding was agreeable to said plan.  Patient overall well-appearing, afebrile in no acute distress. Worrisome signs and symptoms were discussed with the patient, and the patient acknowledged understanding to return to the ED if noticed. Patient was stable upon discharge.          Final Clinical Impression(s) / ED Diagnoses Final diagnoses:  None    Rx / DC Orders ED Discharge Orders          Ordered    albuterol (VENTOLIN HFA) 108 (90 Base) MCG/ACT inhaler  Every 6 hours PRN        10/10/22 1603    nirmatrelvir & ritonavir (PAXLOVID, 300/100,) 20 x 150 MG & 10 x 100MG  TBPK  2 times daily        10/10/22 1603    ibuprofen (ADVIL) 600 MG tablet  Every 6 hours PRN        10/10/22 1603    fluticasone (FLONASE) 50 MCG/ACT nasal spray  Daily        10/10/22 1603    predniSONE (DELTASONE) 20 MG tablet  Daily with breakfast        10/10/22 1603              Peter Garter, [place] 10/10/22 1619    Anders Simmonds T, DO 10/16/22 0932

## 2023-08-13 ENCOUNTER — Observation Stay (HOSPITAL_BASED_OUTPATIENT_CLINIC_OR_DEPARTMENT_OTHER)
Admission: EM | Admit: 2023-08-13 | Discharge: 2023-08-15 | Disposition: A | Discharge status: Home / Self Care | Payer: Self-pay | Attending: Surgery | Admitting: Surgery

## 2023-08-13 ENCOUNTER — Emergency Department (HOSPITAL_BASED_OUTPATIENT_CLINIC_OR_DEPARTMENT_OTHER): Payer: Self-pay

## 2023-08-13 ENCOUNTER — Other Ambulatory Visit: Payer: Self-pay

## 2023-08-13 DIAGNOSIS — Z79899 Other long term (current) drug therapy: Secondary | ICD-10-CM | POA: Insufficient documentation

## 2023-08-13 DIAGNOSIS — R112 Nausea with vomiting, unspecified: Secondary | ICD-10-CM

## 2023-08-13 DIAGNOSIS — K8000 Calculus of gallbladder with acute cholecystitis without obstruction: Principal | ICD-10-CM | POA: Insufficient documentation

## 2023-08-13 DIAGNOSIS — F17210 Nicotine dependence, cigarettes, uncomplicated: Secondary | ICD-10-CM | POA: Insufficient documentation

## 2023-08-13 DIAGNOSIS — K8010 Calculus of gallbladder with chronic cholecystitis without obstruction: Principal | ICD-10-CM

## 2023-08-13 DIAGNOSIS — E876 Hypokalemia: Secondary | ICD-10-CM | POA: Insufficient documentation

## 2023-08-13 DIAGNOSIS — K810 Acute cholecystitis: Secondary | ICD-10-CM | POA: Diagnosis present

## 2023-08-13 DIAGNOSIS — J45909 Unspecified asthma, uncomplicated: Secondary | ICD-10-CM | POA: Insufficient documentation

## 2023-08-13 LAB — CBC
HCT: 38.9 % (ref 36.0–46.0)
Hemoglobin: 13.1 g/dL (ref 12.0–15.0)
MCH: 30.6 pg (ref 26.0–34.0)
MCHC: 33.7 g/dL (ref 30.0–36.0)
MCV: 90.9 fL (ref 80.0–100.0)
Platelets: 345 K/uL (ref 150–400)
RBC: 4.28 MIL/uL (ref 3.87–5.11)
RDW: 12.8 % (ref 11.5–15.5)
WBC: 14.1 K/uL — ABNORMAL HIGH (ref 4.0–10.5)
nRBC: 0 % (ref 0.0–0.2)

## 2023-08-13 LAB — URINALYSIS, MICROSCOPIC (REFLEX)

## 2023-08-13 LAB — URINALYSIS, ROUTINE W REFLEX MICROSCOPIC
Bilirubin Urine: NEGATIVE
Glucose, UA: NEGATIVE mg/dL
Ketones, ur: 80 mg/dL — AB
Leukocytes,Ua: NEGATIVE
Nitrite: NEGATIVE
Protein, ur: 30 mg/dL — AB
Specific Gravity, Urine: 1.02 (ref 1.005–1.030)
pH: 5.5 (ref 5.0–8.0)

## 2023-08-13 LAB — COMPREHENSIVE METABOLIC PANEL WITH GFR
ALT: 9 U/L (ref 0–44)
AST: 18 U/L (ref 15–41)
Albumin: 4.7 g/dL (ref 3.5–5.0)
Alkaline Phosphatase: 77 U/L (ref 38–126)
Anion gap: 15 (ref 5–15)
BUN: 7 mg/dL (ref 6–20)
CO2: 21 mmol/L — ABNORMAL LOW (ref 22–32)
Calcium: 9.9 mg/dL (ref 8.9–10.3)
Chloride: 101 mmol/L (ref 98–111)
Creatinine, Ser: 0.57 mg/dL (ref 0.44–1.00)
GFR, Estimated: >60 mL/min (ref >60)
Glucose, Bld: 99 mg/dL (ref 70–99)
Potassium: 3.2 mmol/L — ABNORMAL LOW (ref 3.5–5.1)
Sodium: 137 mmol/L (ref 135–145)
Total Bilirubin: 0.3 mg/dL (ref 0.0–1.2)
Total Protein: 7.9 g/dL (ref 6.5–8.1)

## 2023-08-13 LAB — PREGNANCY, URINE: Preg Test, Ur: NEGATIVE

## 2023-08-13 LAB — LIPASE, BLOOD: Lipase: 19 U/L (ref 11–51)

## 2023-08-13 MED ORDER — SODIUM CHLORIDE 0.9 % IV SOLN
2.0000 g | Freq: Once | INTRAVENOUS | Status: DC
  Filled 2023-08-13: qty 20

## 2023-08-13 MED ORDER — OXYCODONE HCL 5 MG PO TABS
5.0000 mg | ORAL_TABLET | ORAL | Status: DC | PRN
  Administered 2023-08-14: 5 mg via ORAL
  Filled 2023-08-13: qty 1

## 2023-08-13 MED ORDER — SIMETHICONE 80 MG PO CHEW
80.0000 mg | CHEWABLE_TABLET | Freq: Four times a day (QID) | ORAL | Status: DC | PRN
  Filled 2023-08-13: qty 1

## 2023-08-13 MED ORDER — DOCUSATE SODIUM 100 MG PO CAPS
100.0000 mg | ORAL_CAPSULE | Freq: Two times a day (BID) | ORAL | Status: DC
  Administered 2023-08-14 – 2023-08-15 (×3): 100 mg via ORAL
  Filled 2023-08-13 (×4): qty 1

## 2023-08-13 MED ORDER — ACETAMINOPHEN 325 MG PO TABS
650.0000 mg | ORAL_TABLET | Freq: Four times a day (QID) | ORAL | Status: DC
  Administered 2023-08-14 – 2023-08-15 (×4): 650 mg via ORAL
  Filled 2023-08-13 (×4): qty 2

## 2023-08-13 MED ORDER — ALBUTEROL SULFATE (2.5 MG/3ML) 0.083% IN NEBU: 2.5000 mg | INHALATION_SOLUTION | Freq: Four times a day (QID) | RESPIRATORY_TRACT | Status: DC | PRN

## 2023-08-13 MED ORDER — SODIUM CHLORIDE 0.9 % IV SOLN: Freq: Once | INTRAVENOUS | Status: AC

## 2023-08-13 MED ORDER — MORPHINE SULFATE (PF) 4 MG/ML IV SOLN
4.0000 mg | Freq: Once | INTRAVENOUS | Status: AC
  Administered 2023-08-13: 4 mg via INTRAVENOUS
  Filled 2023-08-13: qty 1

## 2023-08-13 MED ORDER — FLUTICASONE PROPIONATE 50 MCG/ACT NA SUSP
2.0000 | Freq: Every day | NASAL | Status: DC
  Administered 2023-08-14: 2 via NASAL
  Filled 2023-08-13: qty 16

## 2023-08-13 MED ORDER — ALBUTEROL SULFATE HFA 108 (90 BASE) MCG/ACT IN AERS: 1.0000 | INHALATION_SPRAY | Freq: Four times a day (QID) | RESPIRATORY_TRACT | Status: DC | PRN

## 2023-08-13 MED ORDER — SODIUM CHLORIDE 0.9 % IV SOLN
2.0000 g | INTRAVENOUS | Status: DC
  Administered 2023-08-13 – 2023-08-14 (×2): 2 g via INTRAVENOUS
  Filled 2023-08-13: qty 20

## 2023-08-13 MED ORDER — PROCHLORPERAZINE EDISYLATE 10 MG/2ML IJ SOLN
10.0000 mg | INTRAMUSCULAR | Status: DC | PRN
  Administered 2023-08-13 – 2023-08-15 (×4): 10 mg via INTRAVENOUS
  Filled 2023-08-13 (×4): qty 2

## 2023-08-13 MED ORDER — GABAPENTIN 300 MG PO CAPS
300.0000 mg | ORAL_CAPSULE | Freq: Three times a day (TID) | ORAL | Status: DC
  Administered 2023-08-13 – 2023-08-15 (×5): 300 mg via ORAL
  Filled 2023-08-13 (×5): qty 1

## 2023-08-13 MED ORDER — METOCLOPRAMIDE HCL 5 MG/ML IJ SOLN
10.0000 mg | Freq: Once | INTRAMUSCULAR | Status: AC
  Administered 2023-08-13: 10 mg via INTRAVENOUS
  Filled 2023-08-13: qty 2

## 2023-08-13 MED ORDER — METHOCARBAMOL 1000 MG/10ML IJ SOLN
500.0000 mg | Freq: Four times a day (QID) | INTRAMUSCULAR | Status: DC | PRN
  Administered 2023-08-13 – 2023-08-15 (×4): 500 mg via INTRAVENOUS
  Filled 2023-08-13 (×4): qty 10

## 2023-08-13 MED ORDER — POTASSIUM CHLORIDE 10 MEQ/100ML IV SOLN
10.0000 meq | Freq: Once | INTRAVENOUS | Status: AC
  Administered 2023-08-13: 10 meq via INTRAVENOUS
  Filled 2023-08-13: qty 100

## 2023-08-13 MED ORDER — LIDOCAINE 5 % EX PTCH
1.0000 | MEDICATED_PATCH | CUTANEOUS | Status: DC
  Administered 2023-08-13: 1 via TRANSDERMAL
  Filled 2023-08-13: qty 1

## 2023-08-13 MED ORDER — MORPHINE SULFATE (PF) 4 MG/ML IV SOLN: 4.0000 mg | Freq: Once | INTRAVENOUS | Status: DC

## 2023-08-13 MED ORDER — ONDANSETRON HCL 4 MG/2ML IJ SOLN
4.0000 mg | Freq: Four times a day (QID) | INTRAMUSCULAR | Status: DC | PRN
  Administered 2023-08-14: 4 mg via INTRAVENOUS
  Filled 2023-08-13: qty 2

## 2023-08-13 MED ORDER — ENOXAPARIN SODIUM 40 MG/0.4ML IJ SOSY
40.0000 mg | PREFILLED_SYRINGE | INTRAMUSCULAR | Status: DC
  Administered 2023-08-14: 40 mg via SUBCUTANEOUS
  Filled 2023-08-13: qty 0.4

## 2023-08-13 MED ORDER — HYDROMORPHONE HCL 1 MG/ML IJ SOLN
1.0000 mg | Freq: Once | INTRAMUSCULAR | Status: AC
  Administered 2023-08-13: 1 mg via INTRAVENOUS
  Filled 2023-08-13: qty 1

## 2023-08-13 MED ORDER — OXYCODONE-ACETAMINOPHEN 5-325 MG PO TABS
1.0000 | ORAL_TABLET | Freq: Once | ORAL | Status: DC
  Filled 2023-08-13: qty 1

## 2023-08-13 MED ORDER — HYDROMORPHONE HCL 1 MG/ML IJ SOLN
0.5000 mg | INTRAMUSCULAR | Status: DC | PRN
  Administered 2023-08-13: 0.5 mg via INTRAVENOUS
  Administered 2023-08-14: .8 mg via INTRAVENOUS
  Filled 2023-08-13: qty 1

## 2023-08-13 MED ORDER — OXYCODONE HCL 5 MG PO TABS
10.0000 mg | ORAL_TABLET | ORAL | Status: DC | PRN
  Administered 2023-08-13 – 2023-08-15 (×6): 10 mg via ORAL
  Filled 2023-08-13 (×6): qty 2

## 2023-08-13 MED ORDER — KETOROLAC TROMETHAMINE 15 MG/ML IJ SOLN
15.0000 mg | Freq: Once | INTRAMUSCULAR | Status: AC
  Administered 2023-08-13: 15 mg via INTRAVENOUS
  Filled 2023-08-13: qty 1

## 2023-08-13 MED ORDER — ONDANSETRON 4 MG PO TBDP
4.0000 mg | ORAL_TABLET | Freq: Once | ORAL | Status: AC
  Administered 2023-08-13: 4 mg via ORAL
  Filled 2023-08-13: qty 1

## 2023-08-13 MED ORDER — PANTOPRAZOLE SODIUM 40 MG IV SOLR
40.0000 mg | Freq: Once | INTRAVENOUS | Status: AC
  Administered 2023-08-13: 40 mg via INTRAVENOUS
  Filled 2023-08-13: qty 10

## 2023-08-13 MED ORDER — ONDANSETRON HCL 4 MG/2ML IJ SOLN
4.0000 mg | Freq: Once | INTRAMUSCULAR | Status: AC
  Administered 2023-08-13: 4 mg via INTRAVENOUS
  Filled 2023-08-13: qty 2

## 2023-08-13 MED ORDER — SODIUM CHLORIDE 0.9 % IV BOLUS
1000.0000 mL | Freq: Once | INTRAVENOUS | Status: AC
  Administered 2023-08-13: 1000 mL via INTRAVENOUS

## 2023-08-13 MED ORDER — POTASSIUM CHLORIDE CRYS ER 20 MEQ PO TBCR
40.0000 meq | EXTENDED_RELEASE_TABLET | Freq: Once | ORAL | Status: AC
  Administered 2023-08-13: 40 meq via ORAL
  Filled 2023-08-13: qty 2

## 2023-08-13 NOTE — ED Triage Notes (Signed)
 Patient states lower back pain that began a week ago. Reports hx of degenerative disk disease and scoliosis. Vomiting beginning yesterday. States today she thinks she saw bright red blood in her vomit.

## 2023-08-13 NOTE — H&P (Addendum)
 CC: Nausea and vomiting  Requesting provider: rocky Hamilton, PA-C  HPI Gina Lynn is an 33 y.o. female who is here for evaluation because of a abdominal pain and vomiting.  Patient has a history of degenerative disc disease of her back and scoliosis for years.  She states that she had a flare of her lower back pain for the past few days but she started having abdominal pain the day before yesterday and started having vomiting.  She told the ER team that there is some streaks of blood in it but denied any blood in her vomit to me.  She does have chronic constipation and may average about 2 bowel movements per week.  She denies any fevers or chills.  She reports a poor appetite since she started vomiting day before yesterday.  She states that her pain initially started in her left abdomen but at times it is in her epigastric area/upper abdomen.  She controls her chronic back pain by taking 2 tablets of ibuprofen  twice a day.  She does not always take the ibuprofen  on a full stomach.  No dysuria or hematuria.  No change in intermittent numbness and tingling in her legs.  She went to med [facility] and underwent a CT renal protocol followed by an abdominal ultrasound which showed gallstones and perhaps some wall thickening.  Past Medical History:  Diagnosis Date   Asthma    GERD (gastroesophageal reflux disease)    Sinusitis     Past Surgical History:  Procedure Laterality Date   ADENOIDECTOMY     MOUTH SURGERY     NASAL SINUS SURGERY     TONSILLECTOMY      Family History  Problem Relation Age of Onset   Colon cancer Paternal Uncle    Lung cancer Paternal Aunt    Esophageal cancer Neg Hx     Social:  reports that she has been smoking cigarettes. She has never used smokeless tobacco. She reports current alcohol use. She reports current drug use. Drug: Marijuana.  Allergies: No Known Allergies  Medications: I have reviewed the patient's current medications.   ROS - all of the  below systems have been reviewed with the patient and positives are indicated with bold text General: chills, fever or night sweats Eyes: blurry vision or double vision ENT: epistaxis or sore throat Allergy/Immunology: itchy/watery eyes or nasal congestion Hematologic/Lymphatic: bleeding problems, blood clots or swollen lymph nodes Endocrine: temperature intolerance or unexpected weight changes Breast: new or changing breast lumps or nipple discharge Resp: cough, shortness of breath, or wheezing CV: chest pain or dyspnea on exertion GI: as per HPI GU: dysuria, trouble voiding, or hematuria MSK: joint pain or joint stiffness Neuro: TIA or stroke symptoms Derm: pruritus and skin lesion changes Psych: anxiety and depression  PE Blood pressure 114/70, pulse 61, temperature (!) 97.5 F (36.4 C), temperature source Oral, resp. rate 16, SpO2 100%, unknown if currently breastfeeding. Constitutional: Patient was asleep but easily woke up NAD; conversant; no deformities Eyes: Moist conjunctiva; no lid lag; anicteric; PERRL Neck: Trachea midline; no thyromegaly Lungs: Normal respiratory effort; no tactile fremitus CV: RRR; no palpable thrills; no pitting edema GI: Abd soft, nondistended, mild tenderness in epigastric and right upper quadrant.  No significant or real tenderness in left abdomen.  No rebound or guarding; no palpable hepatosplenomegaly MSK:  no clubbing/cyanosis Psychiatric: Appropriate affect; alert and oriented x3 Lymphatic: No palpable cervical or axillary lymphadenopathy Skin: No rash, lesions or jaundice  Results for orders  placed or performed during the hospital encounter of 08/13/23 (from the past 48 hours)  Urinalysis, Routine w reflex microscopic -Urine, Clean Catch     Status: Abnormal   Collection Time: 08/13/23 10:29 AM  Result Value Ref Range   Color, Urine YELLOW YELLOW   APPearance CLEAR CLEAR   Specific Gravity, Urine 1.020 1.005 - 1.030   pH 5.5 5.0 - 8.0    Glucose, UA NEGATIVE NEGATIVE mg/dL   Hgb urine dipstick TRACE (A) NEGATIVE   Bilirubin Urine NEGATIVE NEGATIVE   Ketones, ur 80 (A) NEGATIVE mg/dL   Protein, ur 30 (A) NEGATIVE mg/dL   Nitrite NEGATIVE NEGATIVE   Leukocytes,Ua NEGATIVE NEGATIVE    Comment: Performed at [facility], [address] [place], [place] [address]  Urinalysis, Microscopic (reflex)     Status: Abnormal   Collection Time: 08/13/23 10:29 AM  Result Value Ref Range   RBC / HPF 0-5 0 - 5 RBC/hpf   WBC, UA 0-5 0 - 5 WBC/hpf   Bacteria, UA RARE (A) NONE SEEN   Squamous Epithelial / HPF 0-5 0 - 5 /HPF    Comment: Performed at [facility], [address] [place], [place] [address]  Lipase, blood     Status: None   Collection Time: 08/13/23 10:34 AM  Result Value Ref Range   Lipase 19 11 - 51 U/L    Comment: Performed at [facility], [address] [place], [place] [address]  Comprehensive metabolic panel     Status: Abnormal   Collection Time: 08/13/23 10:34 AM  Result Value Ref Range   Sodium 137 135 - 145 mmol/L   Potassium 3.2 (L) 3.5 - 5.1 mmol/L   Chloride 101 98 - 111 mmol/L   CO2 21 (L) 22 - 32 mmol/L   Glucose, Bld 99 70 - 99 mg/dL    Comment: Glucose reference range applies only to samples taken after fasting for at least 8 hours.   BUN 7 6 - 20 mg/dL   Creatinine, Ser 9.42 0.44 - 1.00 mg/dL   Calcium 9.9 8.9 - 89.6 mg/dL   Total Protein 7.9 6.5 - 8.1 g/dL   Albumin 4.7 3.5 - 5.0 g/dL   AST 18 15 - 41 U/L   ALT 9 0 - 44 U/L   Alkaline Phosphatase 77 38 - 126 U/L   Total Bilirubin 0.3 0.0 - 1.2 mg/dL   GFR, Estimated >39 >39 mL/min    Comment: (NOTE) Calculated using the CKD-EPI Creatinine Equation (2021)    Anion gap 15 5 - 15    Comment: Performed at [facility], [address] [place], [place] [address]  CBC     Status: Abnormal   Collection Time: 08/13/23 10:34 AM  Result Value Ref Range   WBC 14.1 (H) 4.0 - 10.5 K/uL   RBC 4.28 3.87 -  5.11 MIL/uL   Hemoglobin 13.1 12.0 - 15.0 g/dL   HCT 61.0 63.9 - 53.9 %   MCV 90.9 80.0 - 100.0 fL   MCH 30.6 26.0 - 34.0 pg   MCHC 33.7 30.0 - 36.0 g/dL   RDW 87.1 88.4 - 84.4 %   Platelets 345 150 - 400 K/uL   nRBC 0.0 0.0 - 0.2 %    Comment: Performed at [facility], [address] [place], [place] [address]  Pregnancy, urine     Status: None   Collection Time: 08/13/23 10:34  AM  Result Value Ref Range   Preg Test, Ur NEGATIVE NEGATIVE    Comment:        THE SENSITIVITY OF THIS METHODOLOGY IS >20 mIU/mL. Performed at [facility], [address] [place], [place] [address]     US  Abdomen Limited RUQ (LIVER/GB) Result Date: 08/13/2023 CLINICAL DATA:  [id number] Thickening of wall of gallbladder [id number] Nausea AND vomiting [id number]. EXAM: ULTRASOUND ABDOMEN LIMITED RIGHT UPPER QUADRANT COMPARISON:  CT scan renal stone protocol from earlier the same day. FINDINGS: Gallbladder: The gallbladder is physiologically distended. There is mild diffuse gallbladder wall thickening. Small volume calcified gallstones noted, with a larger 1.7 cm gallstone in the fundal region and a smaller gallstone in the neck region. No pericholecystic free fluid. The technologist noted positive sonographic Murphy's sign. Common bile duct: Diameter: Up to 5 mm.  No intrahepatic bile duct dilation. Liver: No focal lesion identified. Within normal limits in parenchymal echogenicity. Portal vein is patent on color Doppler imaging with normal direction of blood flow towards the liver. Other: None. IMPRESSION: *Cholelithiasis with mild diffuse gallbladder wall thickening and positive sonographic Murphy's sign. No pericholecystic free fluid. Findings are equivocal for acute cholecystitis. If clinical suspicion is high, further evaluation with nuclear medicine HIDA scan is recommended. Electronically Signed   By: Ree Molt M.D.   On: 08/13/2023 14:53   CT Renal Stone Study Result Date:  08/13/2023 CLINICAL DATA:  Left flank pain EXAM: CT ABDOMEN AND PELVIS WITHOUT CONTRAST TECHNIQUE: Multidetector CT imaging of the abdomen and pelvis was performed following the standard protocol without IV contrast. RADIATION DOSE REDUCTION: This exam was performed according to the departmental dose-optimization program which includes automated exposure control, adjustment of the mA and/or kV according to patient size and/or use of iterative reconstruction technique. COMPARISON:  None Available. FINDINGS: Lower chest: No acute abnormality. Hepatobiliary: No solid liver abnormality is seen. Mildly distended gallbladder. Gallbladder wall thickening. No visible gallstones. No biliary ductal dilatation. Pancreas: Unremarkable. No pancreatic ductal dilatation or surrounding inflammatory changes. Spleen: Normal in size without significant abnormality. Adrenals/Urinary Tract: Adrenal glands are unremarkable. Kidneys are normal, without renal calculi, solid lesion, or hydronephrosis. Bladder is unremarkable. Stomach/Bowel: Stomach is within normal limits. Appendix appears normal. No evidence of bowel wall thickening, distention, or inflammatory changes. Vascular/Lymphatic: Scattered aortic atherosclerosis. No enlarged abdominal or pelvic lymph nodes. Reproductive: No mass or other significant abnormality. Other: No abdominal wall hernia or abnormality. No ascites. Musculoskeletal: No acute or significant osseous findings. IMPRESSION: 1. Mildly distended gallbladder. Gallbladder wall thickening. No visible gallstones. No biliary ductal dilatation. Correlate with right upper quadrant pain and consider ultrasound to further evaluate. 2. No urinary tract calculi or hydronephrosis. No specific findings to explain left-sided flank pain. 3. Scattered aortic atherosclerosis, advanced for patient age. Aortic Atherosclerosis (ICD10-I70.0). Electronically Signed   By: Marolyn JONETTA Jaksch M.D.   On: 08/13/2023 13:31     Imaging: Personally reviewed  A/P: Shailene Demonbreun is an 33 y.o. female with  Nausea, vomiting, leukocytosis Acute calculus cholecystitis Chronic back pain/scoliosis Hypokalemia  Given the lack of other findings on her CT scan such as colonic concerns, nml ua; nausea/vomiting, leukocytosis, imaging showing gallstones with mild wall thickening I think this is probably consistent with cholecystitis.  Suspicion for gastritis or peptic ulcer is lower.  Pt recd potassium and iV abx at [facility]  Will continue IV antibiotics IV fluids Repeat labs in the morning Will Discuss case with Dr. Lyndel since he excepted the patient for  admission and let him make final decision about plan  Data reviewed: ER notes, ED physician note, vitals, labs, CT renal study, right lower quadrant ultrasound, ED note 2022  High MDM  Camellia HERO. Tanda, MD, FACS General, Bariatric, & Minimally Invasive Surgery [facility]

## 2023-08-13 NOTE — ED Notes (Signed)
 Pt. Reports taking motrin  on an empty stomach several times daily due to back pain lately.    Pt. Reports she has had blood in her emesis and is having abd. Pain.  Pt. Also reports L side back pain.  Pt. Is not actively vomiting at present time.

## 2023-08-13 NOTE — ED Notes (Signed)
 Pt. Dry heaving after ODT zofran  and unable to tolerate oral meds.

## 2023-08-13 NOTE — ED Notes (Signed)
 Called [facility] for transport to [facility] @18 :00. Spoke with [employer]

## 2023-08-13 NOTE — ED Provider Notes (Signed)
 [place] [facility] Provider Note   CSN: [id number] Arrival date & time: 08/13/23  1019     Patient presents with: Back Pain and Vomiting   Gina Lynn is a 33 y.o. female.   33 year old female presenting with worsening back pain/vomiting.  Patient has history of chronic back pain with a diagnosis of degenerative disc disease, typically manages her pain with ibuprofen  but reports during her menstrual cycle her pain tends to get worse, she believes this flareup of pain is secondary to this.  She also notes 2 days of vomiting, has noticed some red streaking in her vomiting today, there is associated epigastric tenderness since the onset of vomiting.  She reports dealing with episodes of vomiting for years, at times she will have episodes like this every few weeks, however it has been a long time since she has been vomiting like she has over the past 2 days, she notes that trigger foods include acidic foods like pizza/tomato-based products, she has to avoid these entirely as they typically result in vomiting.  Occasionally she reports shooting pain down her left leg, this is intermittent.  1 episode of diarrhea but otherwise normal bowel movements denies dysuria/hematuria, abdominal pain, fever, bowel/bladder incontinence, new or worsening sensory deficit lower extremities.   Back Pain      Prior to Admission medications   Medication Sig Start Date End Date Taking? Authorizing Provider  albuterol  (PROVENTIL  HFA;VENTOLIN  HFA) 108 (90 BASE) MCG/ACT inhaler Inhale into the lungs every 6 (six) hours as needed.    [provider]  albuterol  (VENTOLIN  HFA) 108 (90 Base) MCG/ACT inhaler Inhale 1-2 puffs into the lungs every 6 (six) hours as needed for wheezing or shortness of breath. 10/10/22   Silver Wonda LABOR, PA  DiphenhydrAMINE HCl (BENADRYL ALLERGY PO) Take 2 tablets by mouth daily as needed. Patient used this medication for the swelling in  her arm.    [provider]  fluticasone  (FLONASE ) 50 MCG/ACT nasal spray Place 2 sprays into both nostrils daily. 10/10/22   Silver Wonda LABOR, PA  ibuprofen  (ADVIL ) 600 MG tablet Take 1 tablet (600 mg total) by mouth every 6 (six) hours as needed. 10/10/22   Silver Wonda LABOR, PA  Multiple Vitamin (MULTIVITAMIN) tablet Take 1 tablet by mouth daily.    [provider]  pantoprazole  (PROTONIX ) 40 MG tablet Take 1 tablet (40 mg total) by mouth 2 (two) times daily. 02/17/21   Cirigliano, Vito V, DO    Allergies: Patient has no known allergies.    Review of Systems  Musculoskeletal:  Positive for back pain.    Updated Vital Signs  Vitals:   08/13/23 1028 08/13/23 1130 08/13/23 1330  BP: (!) 146/90 135/81 127/83  Pulse: 100 71 65  Resp: 18 17 17   Temp: 99.4 F (37.4 C)    SpO2: 97% 100% 100%     Physical Exam Vitals and nursing note reviewed.  Constitutional:      General: She is in acute distress.  HENT:     Head: Normocephalic.  Eyes:     Extraocular Movements: Extraocular movements intact.  Cardiovascular:     Rate and Rhythm: Normal rate.  Pulmonary:     Effort: Pulmonary effort is normal.  Abdominal:     Palpations: Abdomen is soft.     Tenderness: There is abdominal tenderness (mild, epigastric). There is left CVA tenderness. There is no right CVA tenderness or guarding.  Musculoskeletal:     Cervical back:  Normal range of motion.     Comments: Mild midline L-spine tenderness to palpation, left sided paralumbar muscle tenderness to palpation 5/5 strength against resistance of RLE, 4/5 strength against resistance of LLE  Skin:    General: Skin is warm and dry.  Neurological:     Mental Status: She is oriented to person, place, and time.     Sensory: No sensory deficit.     Comments: No appreciable sensory deficits in bilateral lower extremities     (all labs ordered are listed, but only abnormal results are displayed) Labs Reviewed  COMPREHENSIVE  METABOLIC PANEL WITH GFR - Abnormal; Notable for the following components:      Result Value   Potassium 3.2 (*)    CO2 21 (*)    All other components within normal limits  CBC - Abnormal; Notable for the following components:   WBC 14.1 (*)    All other components within normal limits  URINALYSIS, ROUTINE W REFLEX MICROSCOPIC - Abnormal; Notable for the following components:   Hgb urine dipstick TRACE (*)    Ketones, ur 80 (*)    Protein, ur 30 (*)    All other components within normal limits  URINALYSIS, MICROSCOPIC (REFLEX) - Abnormal; Notable for the following components:   Bacteria, UA RARE (*)    All other components within normal limits  URINE CULTURE  LIPASE, BLOOD  PREGNANCY, URINE  HIV ANTIBODY (ROUTINE TESTING W REFLEX)  CBC  CREATININE, SERUM  BASIC METABOLIC PANEL WITH GFR  CBC  HEPATIC FUNCTION PANEL    EKG: None  Radiology: US  Abdomen Limited RUQ (LIVER/GB) Result Date: 08/13/2023 CLINICAL DATA:  [id number] Thickening of wall of gallbladder [id number] Nausea AND vomiting [id number]. EXAM: ULTRASOUND ABDOMEN LIMITED RIGHT UPPER QUADRANT COMPARISON:  CT scan renal stone protocol from earlier the same day. FINDINGS: Gallbladder: The gallbladder is physiologically distended. There is mild diffuse gallbladder wall thickening. Small volume calcified gallstones noted, with a larger 1.7 cm gallstone in the fundal region and a smaller gallstone in the neck region. No pericholecystic free fluid. The technologist noted positive sonographic Murphy's sign. Common bile duct: Diameter: Up to 5 mm.  No intrahepatic bile duct dilation. Liver: No focal lesion identified. Within normal limits in parenchymal echogenicity. Portal vein is patent on color Doppler imaging with normal direction of blood flow towards the liver. Other: None. IMPRESSION: *Cholelithiasis with mild diffuse gallbladder wall thickening and positive sonographic Murphy's sign. No pericholecystic free fluid. Findings are  equivocal for acute cholecystitis. If clinical suspicion is high, further evaluation with nuclear medicine HIDA scan is recommended. Electronically Signed   By: Ree Molt M.D.   On: 08/13/2023 14:53   CT Renal Stone Study Result Date: 08/13/2023 CLINICAL DATA:  Left flank pain EXAM: CT ABDOMEN AND PELVIS WITHOUT CONTRAST TECHNIQUE: Multidetector CT imaging of the abdomen and pelvis was performed following the standard protocol without IV contrast. RADIATION DOSE REDUCTION: This exam was performed according to the departmental dose-optimization program which includes automated exposure control, adjustment of the mA and/or kV according to patient size and/or use of iterative reconstruction technique. COMPARISON:  None Available. FINDINGS: Lower chest: No acute abnormality. Hepatobiliary: No solid liver abnormality is seen. Mildly distended gallbladder. Gallbladder wall thickening. No visible gallstones. No biliary ductal dilatation. Pancreas: Unremarkable. No pancreatic ductal dilatation or surrounding inflammatory changes. Spleen: Normal in size without significant abnormality. Adrenals/Urinary Tract: Adrenal glands are unremarkable. Kidneys are normal, without renal calculi, solid lesion, or hydronephrosis. Bladder is unremarkable. Stomach/Bowel: Stomach  is within normal limits. Appendix appears normal. No evidence of bowel wall thickening, distention, or inflammatory changes. Vascular/Lymphatic: Scattered aortic atherosclerosis. No enlarged abdominal or pelvic lymph nodes. Reproductive: No mass or other significant abnormality. Other: No abdominal wall hernia or abnormality. No ascites. Musculoskeletal: No acute or significant osseous findings. IMPRESSION: 1. Mildly distended gallbladder. Gallbladder wall thickening. No visible gallstones. No biliary ductal dilatation. Correlate with right upper quadrant pain and consider ultrasound to further evaluate. 2. No urinary tract calculi or hydronephrosis. No  specific findings to explain left-sided flank pain. 3. Scattered aortic atherosclerosis, advanced for patient age. Aortic Atherosclerosis (ICD10-I70.0). Electronically Signed   By: Marolyn JONETTA Jaksch M.D.   On: 08/13/2023 13:31     Procedures   Medications Ordered in the ED  lidocaine (LIDODERM) 5 % 1 patch (1 patch Transdermal Patch Applied 08/13/23 1254)  potassium chloride 10 mEq in 100 mL IVPB (10 mEq Intravenous New Bag/Given 08/13/23 1348)  ondansetron  (ZOFRAN -ODT) disintegrating tablet 4 mg (4 mg Oral Given 08/13/23 1227)  morphine (PF) 4 MG/ML injection 4 mg (4 mg Intravenous Given 08/13/23 1256)  ondansetron  (ZOFRAN ) injection 4 mg (4 mg Intravenous Given 08/13/23 1255)  sodium chloride  0.9 % bolus 1,000 mL (1,000 mLs Intravenous New Bag/Given 08/13/23 1344)  pantoprazole  (PROTONIX ) injection 40 mg (40 mg Intravenous Given 08/13/23 1344)                                    Medical Decision Making This patient presents to the ED for concern of nausea/vomiting/abdominal pain, this involves an extensive number of treatment options, and is a complaint that carries with it a high risk of complications and morbidity.  The differential diagnosis includes renal stone, pyelonephritis, cholecystitis, pancreatitis, chronic back pain.   Co morbidities that complicate the patient evaluation  History of chronic back pain   Additional history obtained:  Additional history obtained from record review External records from outside source obtained and reviewed including prior ED notes   Lab Tests:  I Ordered, and personally interpreted labs.  The pertinent results include: CBC notable for leukocytosis of 14.1.  CMP notable for mild hypokalemia with potassium 3.2.  Lipase within normal limits.  Urine hCG negative.  Urinalysis notable for trace RBCs, ketones/protein with rare bacteria, this is likely most consistent with dehydration but will send for culture.   Imaging Studies ordered:  I ordered  imaging studies including CT renal study, RUQ US   I independently visualized and interpreted imaging which showed  1. Mildly distended gallbladder. Gallbladder wall thickening. No visible gallstones. No biliary ductal dilatation. Correlate with right upper quadrant pain and consider ultrasound to further evaluate. 2. No urinary tract calculi or hydronephrosis. No specific findings to explain left-sided flank pain. 3. Scattered aortic atherosclerosis, advanced for patient age. RUQ US : *Cholelithiasis with mild diffuse gallbladder wall thickening and positive sonographic Murphy's sign. No pericholecystic free fluid. Findings are equivocal for acute cholecystitis. If clinical suspicion is high, further evaluation with nuclear medicine HIDA scan is recommended.  I agree with the radiologist interpretation   Cardiac Monitoring: / EKG:  The patient was maintained on a cardiac monitor.  I personally viewed and interpreted the cardiac monitored which showed an underlying rhythm of: NSR   Consultations Obtained:  I requested consultation with the general surgery team,  and discussed lab and imaging findings as well as pertinent plan - they recommend: Spoke with Dr. Lyndel with general surgery,  they plan to admit the patient to their service with a plan for cholecystectomy tomorrow, keep patient NPO after midnight and transfer to [facility].    Problem List / ED Course / Critical interventions / Medication management  Initially, I attempted to give this patient p.o. pain medications including Percocet/ODT Zofran , however she did have an additional episode of vomiting after receiving the Zofran .  I discontinued PO meds and continued with IV medications. I ordered medication including morphine for pain control, Zofran  for nausea, Protonix  for nausea/vomiting/epigastric pain, Reglan for nausea, Toradol  for pain, IV fluids for rehydration, dilaudid for pain Reevaluation of the patient after these  medicines showed that the patient improved I have reviewed the patients home medicines and have made adjustments as needed   Social Determinants of Health:  Former tobacco use   Test / Admission - Considered:  Physical exam notable as above.  Patient does appear to be in a great deal of pain, was unable to tolerate p.o. medications therefore was transitioned to IV meds.  Workup is notable as above.  CT imaging was notable for gallbladder wall thickening, therefore I did elect to proceed with a right upper quadrant ultrasound given patient does have some epigastric tenderness to palpation and has had recurrent episodes of vomiting, see above for ultrasound results. Patient has responded well to morphine/Protonix /Zofran , no recurrent episodes of vomiting.  Will p.o. challenge with oral potassium/drink. Patient had subsequent vomiting after p.o. challenge, given Reglan and Toradol  for additional nausea/pain.  I suspect that patient's intractable nausea/vomiting may be secondary to symptomatic cholelithiasis given equivocal acute cholecystitis read on right upper quadrant ultrasound.  Will speak with general surgery for further guidance but patient will likely benefit from admission. Spoke with Dr. Lyndel with general surgery, see above for their recommendations.  Patient is appropriate for admission and will be transferred to [facility] for further management.  Patient mentions that she is on p.o. penicillin  for tooth infection, advised her it is okay to take this medication before midnight and compliance with her instructions from her dentist.  Amount and/or Complexity of Data Reviewed Labs: ordered. Radiology: ordered.  Risk Prescription drug management. Decision regarding hospitalization.        Final diagnoses:  Calculus of gallbladder with cholecystitis without biliary obstruction, unspecified cholecystitis acuity  Nausea and vomiting, unspecified vomiting type    ED  Discharge Orders     None          Glendia Rocky SAILOR, [place] 08/13/23 1759    Elnor Jayson LABOR, DO 08/13/23 1937    Elnor Jayson LABOR, DO 08/13/23 1938

## 2023-08-14 ENCOUNTER — Observation Stay (HOSPITAL_COMMUNITY): Payer: Self-pay | Admitting: Anesthesiology

## 2023-08-14 ENCOUNTER — Encounter (HOSPITAL_COMMUNITY)
Admission: EM | Disposition: A | Discharge status: Home / Self Care | Payer: Self-pay | Source: Home / Self Care | Attending: Emergency Medicine

## 2023-08-14 ENCOUNTER — Other Ambulatory Visit: Payer: Self-pay

## 2023-08-14 DIAGNOSIS — K810 Acute cholecystitis: Secondary | ICD-10-CM

## 2023-08-14 HISTORY — PX: CHOLECYSTECTOMY: SHX55

## 2023-08-14 LAB — CBC
HCT: 38.1 % (ref 36.0–46.0)
Hemoglobin: 11.8 g/dL — ABNORMAL LOW (ref 12.0–15.0)
MCH: 29.6 pg (ref 26.0–34.0)
MCHC: 31 g/dL (ref 30.0–36.0)
MCV: 95.7 fL (ref 80.0–100.0)
Platelets: 267 K/uL (ref 150–400)
RBC: 3.98 MIL/uL (ref 3.87–5.11)
RDW: 13 % (ref 11.5–15.5)
WBC: 8.6 K/uL (ref 4.0–10.5)
nRBC: 0 % (ref 0.0–0.2)

## 2023-08-14 LAB — URINE CULTURE

## 2023-08-14 LAB — BASIC METABOLIC PANEL WITH GFR
Anion gap: 8 (ref 5–15)
BUN: 10 mg/dL (ref 6–20)
CO2: 25 mmol/L (ref 22–32)
Calcium: 9 mg/dL (ref 8.9–10.3)
Chloride: 105 mmol/L (ref 98–111)
Creatinine, Ser: 0.62 mg/dL (ref 0.44–1.00)
GFR, Estimated: >60 mL/min (ref >60)
Glucose, Bld: 90 mg/dL (ref 70–99)
Potassium: 3.7 mmol/L (ref 3.5–5.1)
Sodium: 138 mmol/L (ref 135–145)

## 2023-08-14 LAB — HEPATIC FUNCTION PANEL
ALT: 12 U/L (ref 0–44)
AST: 16 U/L (ref 15–41)
Albumin: 3.6 g/dL (ref 3.5–5.0)
Alkaline Phosphatase: 55 U/L (ref 38–126)
Bilirubin, Direct: 0.1 mg/dL (ref 0.0–0.2)
Indirect Bilirubin: 0.9 mg/dL (ref 0.3–0.9)
Total Bilirubin: 1 mg/dL (ref 0.0–1.2)
Total Protein: 6.1 g/dL — ABNORMAL LOW (ref 6.5–8.1)

## 2023-08-14 LAB — HIV ANTIBODY (ROUTINE TESTING W REFLEX): HIV Screen 4th Generation wRfx: NONREACTIVE

## 2023-08-14 SURGERY — LAPAROSCOPIC CHOLECYSTECTOMY
Anesthesia: General | Site: Abdomen

## 2023-08-14 MED ORDER — LACTATED RINGERS IR SOLN
Status: DC | PRN
Start: 2023-08-14 — End: 2023-08-14
  Administered 2023-08-14: 1000 mL

## 2023-08-14 MED ORDER — ENSURE PLUS HIGH PROTEIN PO LIQD
237.0000 mL | Freq: Two times a day (BID) | ORAL | Status: DC
  Administered 2023-08-15: 237 mL via ORAL

## 2023-08-14 MED ORDER — DEXMEDETOMIDINE HCL IN NACL 80 MCG/20ML IV SOLN
INTRAVENOUS | Status: AC
Start: 2023-08-14 — End: 2023-08-14
  Filled 2023-08-14: qty 20

## 2023-08-14 MED ORDER — PROPOFOL 10 MG/ML IV BOLUS
INTRAVENOUS | Status: AC
  Filled 2023-08-14: qty 20

## 2023-08-14 MED ORDER — BUPIVACAINE-EPINEPHRINE (PF) 0.25% -1:200000 IJ SOLN
INTRAMUSCULAR | Status: AC
  Filled 2023-08-14: qty 30

## 2023-08-14 MED ORDER — FENTANYL CITRATE (PF) 100 MCG/2ML IJ SOLN
INTRAMUSCULAR | Status: AC
  Filled 2023-08-14: qty 2

## 2023-08-14 MED ORDER — FENTANYL CITRATE PF 50 MCG/ML IJ SOSY
PREFILLED_SYRINGE | INTRAMUSCULAR | Status: AC
  Filled 2023-08-14: qty 1

## 2023-08-14 MED ORDER — CEFAZOLIN SODIUM-DEXTROSE 2-3 GM-%(50ML) IV SOLR
INTRAVENOUS | Status: DC | PRN
  Administered 2023-08-14: 2 g via INTRAVENOUS

## 2023-08-14 MED ORDER — SUGAMMADEX SODIUM 200 MG/2ML IV SOLN
INTRAVENOUS | Status: DC | PRN
  Administered 2023-08-14: 200 mg via INTRAVENOUS

## 2023-08-14 MED ORDER — ONDANSETRON HCL 4 MG/2ML IJ SOLN
INTRAMUSCULAR | Status: AC
  Filled 2023-08-14: qty 2

## 2023-08-14 MED ORDER — HYDROMORPHONE HCL 2 MG/ML IJ SOLN
INTRAMUSCULAR | Status: AC
  Filled 2023-08-14: qty 1

## 2023-08-14 MED ORDER — ROCURONIUM BROMIDE 10 MG/ML (PF) SYRINGE
PREFILLED_SYRINGE | INTRAVENOUS | Status: DC | PRN
  Administered 2023-08-14: 50 mg via INTRAVENOUS

## 2023-08-14 MED ORDER — BUPIVACAINE-EPINEPHRINE 0.25% -1:200000 IJ SOLN
INTRAMUSCULAR | Status: DC | PRN
  Administered 2023-08-14: 30 mL

## 2023-08-14 MED ORDER — OXYCODONE-ACETAMINOPHEN 5-325 MG PO TABS: 1.0000 | ORAL_TABLET | ORAL | 0 refills | Status: AC | PRN

## 2023-08-14 MED ORDER — FENTANYL CITRATE (PF) 100 MCG/2ML IJ SOLN
INTRAMUSCULAR | Status: DC | PRN
  Administered 2023-08-14: 100 ug via INTRAVENOUS

## 2023-08-14 MED ORDER — OXYCODONE HCL 5 MG PO TABS
5.0000 mg | ORAL_TABLET | Freq: Once | ORAL | Status: AC | PRN
  Administered 2023-08-14: 5 mg via ORAL

## 2023-08-14 MED ORDER — ONDANSETRON HCL 4 MG/2ML IJ SOLN: 4.0000 mg | Freq: Four times a day (QID) | INTRAMUSCULAR | Status: DC | PRN

## 2023-08-14 MED ORDER — MIDAZOLAM HCL 2 MG/2ML IJ SOLN
INTRAMUSCULAR | Status: DC | PRN
  Administered 2023-08-14: 2 mg via INTRAVENOUS

## 2023-08-14 MED ORDER — ROCURONIUM BROMIDE 10 MG/ML (PF) SYRINGE
PREFILLED_SYRINGE | INTRAVENOUS | Status: AC
  Filled 2023-08-14: qty 10

## 2023-08-14 MED ORDER — OXYCODONE HCL 5 MG/5ML PO SOLN: 5.0000 mg | Freq: Once | ORAL | Status: AC | PRN

## 2023-08-14 MED ORDER — PHENYLEPHRINE 80 MCG/ML (10ML) SYRINGE FOR IV PUSH (FOR BLOOD PRESSURE SUPPORT)
PREFILLED_SYRINGE | INTRAVENOUS | Status: AC
  Filled 2023-08-14: qty 10

## 2023-08-14 MED ORDER — ONDANSETRON HCL 4 MG/2ML IJ SOLN
INTRAMUSCULAR | Status: DC | PRN
Start: 2023-08-14 — End: 2023-08-14
  Administered 2023-08-14: 4 mg via INTRAVENOUS

## 2023-08-14 MED ORDER — OXYCODONE HCL 5 MG PO TABS
ORAL_TABLET | ORAL | Status: AC
Start: 2023-08-14 — End: 2023-08-14
  Filled 2023-08-14: qty 1

## 2023-08-14 MED ORDER — DEXMEDETOMIDINE HCL IN NACL 80 MCG/20ML IV SOLN
INTRAVENOUS | Status: DC | PRN
  Administered 2023-08-14 (×3): 4 ug via INTRAVENOUS

## 2023-08-14 MED ORDER — LACTATED RINGERS IV SOLN: INTRAVENOUS | Status: DC | PRN

## 2023-08-14 MED ORDER — PROPOFOL 10 MG/ML IV BOLUS
INTRAVENOUS | Status: DC | PRN
  Administered 2023-08-14: 200 mg via INTRAVENOUS

## 2023-08-14 MED ORDER — DEXAMETHASONE SODIUM PHOSPHATE 10 MG/ML IJ SOLN
INTRAMUSCULAR | Status: DC | PRN
  Administered 2023-08-14: 8 mg via INTRAVENOUS

## 2023-08-14 MED ORDER — FENTANYL CITRATE PF 50 MCG/ML IJ SOSY
25.0000 ug | PREFILLED_SYRINGE | INTRAMUSCULAR | Status: DC | PRN
  Administered 2023-08-14: 50 ug via INTRAVENOUS

## 2023-08-14 MED ORDER — DEXAMETHASONE SODIUM PHOSPHATE 10 MG/ML IJ SOLN
INTRAMUSCULAR | Status: AC
  Filled 2023-08-14: qty 1

## 2023-08-14 MED ORDER — MIDAZOLAM HCL 2 MG/2ML IJ SOLN
INTRAMUSCULAR | Status: AC
  Filled 2023-08-14: qty 2

## 2023-08-14 MED ORDER — LIDOCAINE HCL (PF) 2 % IJ SOLN
INTRAMUSCULAR | Status: AC
Start: 2023-08-14 — End: 2023-08-14
  Filled 2023-08-14: qty 5

## 2023-08-14 MED ORDER — LIDOCAINE HCL (PF) 2 % IJ SOLN
INTRAMUSCULAR | Status: DC | PRN
  Administered 2023-08-14: 100 mg via INTRADERMAL

## 2023-08-14 SURGICAL SUPPLY — 33 items
BAG COUNTER SPONGE SURGICOUNT (BAG) IMPLANT
CABLE HIGH FREQUENCY MONO STRZ (ELECTRODE) ×1 IMPLANT
CATH URETL OPEN 5X70 (CATHETERS) IMPLANT
CHLORAPREP W/TINT 26 (MISCELLANEOUS) ×1 IMPLANT
CLIP APPLIE ROT 10 11.4 M/L (STAPLE) ×1 IMPLANT
COVER MAYO STAND XLG (MISCELLANEOUS) ×1 IMPLANT
COVER SURGICAL LIGHT HANDLE (MISCELLANEOUS) ×1 IMPLANT
DERMABOND ADVANCED .7 DNX12 (GAUZE/BANDAGES/DRESSINGS) ×1 IMPLANT
DRAPE C-ARM 42X120 X-RAY (DRAPES) IMPLANT
ELECT REM PT RETURN 15FT ADLT (MISCELLANEOUS) ×1 IMPLANT
ENDOLOOP SUT PDS II 0 18 (SUTURE) ×1 IMPLANT
GLOVE BIO SURGEON STRL SZ7.5 (GLOVE) ×1 IMPLANT
GLOVE INDICATOR 8.0 STRL GRN (GLOVE) ×1 IMPLANT
GOWN STRL REUS W/ TWL XL LVL3 (GOWN DISPOSABLE) ×1 IMPLANT
GRASPER SUT TROCAR 14GX15 (MISCELLANEOUS) IMPLANT
HEMOSTAT SNOW SURGICEL 2X4 (HEMOSTASIS) IMPLANT
IRRIGATION SUCT STRKRFLW 2 WTP (MISCELLANEOUS) ×1 IMPLANT
IV CATH 14GX2 1/4 (CATHETERS) ×1 IMPLANT
KIT BASIN OR (CUSTOM PROCEDURE TRAY) ×1 IMPLANT
KIT TURNOVER KIT A (KITS) ×1 IMPLANT
NDL INSUFFLATION 14GA 120MM (NEEDLE) ×1 IMPLANT
NEEDLE INSUFFLATION 14GA 120MM (NEEDLE) ×1 IMPLANT
POUCH RETRIEVAL ECOSAC 10 (ENDOMECHANICALS) ×1 IMPLANT
SCISSORS LAP 5X35 DISP (ENDOMECHANICALS) ×1 IMPLANT
SET TUBE SMOKE EVAC HIGH FLOW (TUBING) ×1 IMPLANT
SLEEVE Z-THREAD 5X100MM (TROCAR) ×2 IMPLANT
SPIKE FLUID TRANSFER (MISCELLANEOUS) ×1 IMPLANT
STOPCOCK 4 WAY LG BORE MALE ST (IV SETS) IMPLANT
SUT MNCRL AB 4-0 PS2 18 (SUTURE) ×1 IMPLANT
TOWEL OR 17X26 10 PK STRL BLUE (TOWEL DISPOSABLE) ×1 IMPLANT
TRAY LAPAROSCOPIC (CUSTOM PROCEDURE TRAY) ×1 IMPLANT
TROCAR ADV FIXATION 12X100MM (TROCAR) ×1 IMPLANT
TROCAR Z-THREAD OPTICAL 5X100M (TROCAR) ×1 IMPLANT

## 2023-08-14 NOTE — Plan of Care (Signed)
  Problem: Education: Goal: Knowledge of General Education information will improve Description: Including pain rating scale, medication(s)/side effects and non-pharmacologic comfort measures Outcome: Progressing   Problem: Health Behavior/Discharge Planning: Goal: Ability to manage health-related needs will improve Outcome: Progressing   Problem: Clinical Measurements: Goal: Ability to maintain clinical measurements within normal limits will improve Outcome: Progressing Goal: Will remain free from infection Outcome: Progressing Goal: Diagnostic test results will improve Outcome: Progressing Goal: Respiratory complications will improve Outcome: Progressing Goal: Cardiovascular complication will be avoided Outcome: Progressing   Problem: Activity: Goal: Risk for activity intolerance will decrease Outcome: Progressing   Problem: Coping: Goal: Level of anxiety will decrease Outcome: Progressing   Problem: Elimination: Goal: Will not experience complications related to bowel motility Outcome: Progressing Goal: Will not experience complications related to urinary retention Outcome: Progressing   Problem: Safety: Goal: Ability to remain free from injury will improve Outcome: Progressing   Problem: Skin Integrity: Goal: Risk for impaired skin integrity will decrease Outcome: Progressing   Problem: Nutrition: Goal: Adequate nutrition will be maintained Outcome: Not Progressing   Problem: Pain Managment: Goal: General experience of comfort will improve and/or be controlled Outcome: Not Progressing

## 2023-08-14 NOTE — Plan of Care (Signed)

## 2023-08-14 NOTE — Anesthesia Procedure Notes (Signed)
 Procedure Name: Intubation Date/Time: 08/14/2023 1:30 PM  Performed by: Obadiah Reyes BROCKS, CRNAPre-anesthesia Checklist: Patient identified, Emergency Drugs available, Suction available and Patient being monitored Patient Re-evaluated:Patient Re-evaluated prior to induction Oxygen Delivery Method: Circle System Utilized Preoxygenation: Pre-oxygenation with 100% oxygen Induction Type: IV induction Ventilation: Mask ventilation without difficulty Laryngoscope Size: Miller and 2 Grade View: Grade I Tube type: Oral Number of attempts: 1 Airway Equipment and Method: Stylet and Oral airway Placement Confirmation: ETT inserted through vocal cords under direct vision, positive ETCO2 and breath sounds checked- equal and bilateral Secured at: 21 cm Tube secured with: Tape Dental Injury: Teeth and Oropharynx as per pre-operative assessment

## 2023-08-14 NOTE — Progress Notes (Signed)
 Progress Note: General Surgery Service   Chief Complaint/Subjective: Still having some right upper quadrant abdominal pain.  This has been going on for multiple years without really knowing what was going on.  Objective: Vital signs in last 24 hours: Temp:  [97.5 F (36.4 C)-98.6 F (37 C)] 98.1 F (36.7 C) (07/19 0840) Pulse Rate:  [52-74] 56 (07/19 0840) Resp:  [9-20] 16 (07/19 0840) BP: (100-135)/(51-88) 109/61 (07/19 0840) SpO2:  [98 %-100 %] 100 % (07/19 0840) Last BM Date : 08/13/23  Intake/Output from previous day: 07/18 0701 - 07/19 0700 In: 1766.9 [I.V.:565; IV Piggyback:1201.9] Out: -  Intake/Output this shift: No intake/output data recorded.  Constitutional: NAD; conversant; no deformities Eyes: Moist conjunctiva; no lid lag; anicteric; PERRL Neck: Trachea midline; no thyromegaly Lungs: Normal respiratory effort; no tactile fremitus CV: RRR; no palpable thrills; no pitting edema GI: Abd soft, tender right upper quadrant; no palpable hepatosplenomegaly MSK: Normal range of motion of extremities; no clubbing/cyanosis Psychiatric: Appropriate affect; alert and oriented x3 Lymphatic: No palpable cervical or axillary lymphadenopathy  Lab Results: CBC  Recent Labs    08/13/23 1034  WBC 14.1*  HGB 13.1  HCT 38.9  PLT 345   BMET Recent Labs    08/13/23 1034  NA 137  K 3.2*  CL 101  CO2 21*  GLUCOSE 99  BUN 7  CREATININE 0.57  CALCIUM 9.9   PT/INR No results for input(s): LABPROT, INR in the last 72 hours. ABG No results for input(s): PHART, HCO3 in the last 72 hours.  Invalid input(s): PCO2, PO2  Anti-infectives: Anti-infectives (From admission, onward)    Start     Dose/Rate Route Frequency Ordered Stop   08/13/23 1800  cefTRIAXone  (ROCEPHIN ) 2 g in sodium chloride  0.9 % 100 mL IVPB        2 g 200 mL/hr over 30 Minutes Intravenous Every 24 hours 08/13/23 1754 08/20/23 1759   08/13/23 1730  cefTRIAXone  (ROCEPHIN ) 2 g in sodium  chloride 0.9 % 100 mL IVPB  Status:  Discontinued        2 g 200 mL/hr over 30 Minutes Intravenous  Once 08/13/23 1726 08/13/23 1756       Medications: Scheduled Meds:  acetaminophen   650 mg Oral Q6H   docusate sodium   100 mg Oral BID   enoxaparin  (LOVENOX ) injection  40 mg Subcutaneous Q24H   fluticasone   2 spray Each Nare Daily   gabapentin   300 mg Oral TID   lidocaine   1 patch Transdermal Q24H    morphine  injection  4 mg Intravenous Once   Continuous Infusions:  cefTRIAXone  (ROCEPHIN )  IV Stopped (08/13/23 1850)   PRN Meds:.albuterol , HYDROmorphone  (DILAUDID ) injection, methocarbamol  (ROBAXIN ) injection, ondansetron  (ZOFRAN ) IV, oxyCODONE , oxyCODONE , prochlorperazine , simethicone   Assessment/Plan: Gina Lynn has abdominal pain, gallstones, and imaging consistent with acute cholecystitis.  I recommended laparoscopic cholecystectomy.  We discussed the procedure itself as well as its risk, benefits, and alternatives.  The risks discussed included were not limited to the risk of infection, bleeding, damage nearby structures, bile leak.  After full discussion all questions answered the patient had consent to proceed.  We will proceed to surgery today once the OR is available.    LOS: 0 days    Deward JINNY Foy, MD  [facility] Use [email or web address] to contact on call provider  Daily Billing: [id number] - Straightforward / Low MDM

## 2023-08-14 NOTE — Anesthesia Preprocedure Evaluation (Signed)
 Anesthesia Evaluation  Patient identified by MRN, date of birth, ID band Patient awake    Reviewed: Allergy & Precautions, H&P , NPO status , Patient's Chart, lab work & pertinent test results  Airway Mallampati: II   Neck ROM: full    Dental   Pulmonary asthma , Current Smoker   breath sounds clear to auscultation       Cardiovascular negative cardio ROS  Rhythm:regular Rate:Normal     Neuro/Psych    GI/Hepatic ,GERD  ,,  Endo/Other    Renal/GU      Musculoskeletal   Abdominal   Peds  Hematology   Anesthesia Other Findings   Reproductive/Obstetrics                              Anesthesia Physical Anesthesia Plan  ASA: 2  Anesthesia Plan: General   Post-op Pain Management:    Induction: Intravenous  PONV Risk Score and Plan: 2 and Ondansetron , Dexamethasone , Midazolam  and Treatment may vary due to age or medical condition  Airway Management Planned: Oral ETT  Additional Equipment:   Intra-op Plan:   Post-operative Plan: Extubation in OR  Informed Consent: I have reviewed the patients History and Physical, chart, labs and discussed the procedure including the risks, benefits and alternatives for the proposed anesthesia with the patient or authorized representative who has indicated his/her understanding and acceptance.     Dental advisory given  Plan Discussed with: CRNA, Anesthesiologist and Surgeon  Anesthesia Plan Comments:         Anesthesia Quick Evaluation

## 2023-08-14 NOTE — Transfer of Care (Signed)
 Immediate Anesthesia Transfer of Care Note  Patient: Gina Lynn  Procedure(s) Performed: LAPAROSCOPIC CHOLECYSTECTOMY (Abdomen)  Patient Location: PACU  Anesthesia Type:General  Level of Consciousness: sedated and responds to stimulation  Airway & Oxygen Therapy: Patient Spontanous Breathing and Patient connected to nasal cannula oxygen  Post-op Assessment: Report given to RN and Post -op Vital signs reviewed and stable  Post vital signs: Reviewed and stable  Last Vitals:  Vitals Value Taken Time  BP 108/57 08/14/23 14:50  Temp    Pulse 60 08/14/23 14:51  Resp 12 08/14/23 14:51  SpO2 100 % 08/14/23 14:51  Vitals shown include unfiled device data.  Last Pain:  Vitals:   08/14/23 1246  TempSrc: Oral  PainSc:          Complications: No notable events documented.

## 2023-08-14 NOTE — Op Note (Signed)
 Patient: Gina Lynn ([birth year], [id number])  Date of Surgery: 08/14/2023  Preoperative Diagnosis: Choleystitis   Postoperative Diagnosis: Choleystitis   Surgical Procedure: LAPAROSCOPIC CHOLECYSTECTOMY:     Operative Team Members:  Surgeons and Role:    * Gina Lynn, Gina PARAS, Lynn - Primary   Anesthesiologist: Gina Cornet, MD CRNA: Gina Lynn BROCKS, CRNA   Anesthesia: General   Fluids:  No intake/output data recorded.  Complications: None  Drains:  none   Specimen:  ID Type Source Tests Collected by Time Destination  1 : gallbladder Tissue PATH Gallbladder SURGICAL PATHOLOGY Gina Lynn 08/14/2023 1357      Disposition:  PACU - hemodynamically stable.  Plan of Care: Continue inpatient care    Indications for Procedure: Gina Lynn is a 33 y.o. female who presented with abdominal pain.  History, physical and imaging was concerning for cholecystitis.  Laparoscopic cholecystectomy was recommended for the patient.  The procedure itself, as well as the risks, benefits and alternatives were discussed with the patient.  Risks discussed included but were not limited to the risk of infection, bleeding, damage to nearby structures, need to convert to open procedure, incisional hernia, bile leak, common bile duct injury and the need for additional procedures or surgeries.  With this discussion complete and all questions answered the patient granted consent to proceed.  Findings: inflamed gallbladder  Infection status: Patient: Gina Lynn Emergency General Surgery Service Patient Case: Urgent Infection Present At Time Of Surgery (PATOS): Inflamed gallbladder   Description of Procedure:   On the date stated above, the patient was taken to the operating room suite and placed in supine positioning.  Sequential compression devices were placed on the lower extremities to prevent blood clots.  General endotracheal anesthesia was induced. Preoperative  antibiotics were given.  The patient's abdomen was prepped and draped in the usual sterile fashion.  A time-out was completed verifying the correct patient, procedure, positioning and equipment needed for the case.  We began by anesthetizing the skin with local anesthetic and then making a 5 mm incision just below the umbilicus.  We dissected through the subcutaneous tissues to the fascia.  The fascia was grasped and elevated using a Kocher clamp.  A Veress needle was inserted into the abdomen and the abdomen was insufflated to 15 mmHg.  A 5 mm trocar was inserted in this position under optical guidance and then the abdomen was inspected.  There was no trauma to the underlying viscera with initial trocar placement.  Any abnormal findings, other than inflammation in the right upper quadrant, are listed above in the findings section.  Three additional trocars were placed, one 12 mm trocar in the subxiphoid position, one 5 mm trocar in the midline epigastric area and one 5mm trocar in the right upper quadrant subcostally.  These were placed under direct vision without any trauma to the underlying viscera.    The patient was then placed in head up, left side down positioning.  The gallbladder was identified and dissected free from its attachments to the omentum allowing the duodenum to fall away.  The infundibulum of the gallbladder was dissected free working laterally to medially.  The cystic duct and cystic artery were dissected free from surrounding connective tissue.  The infundibulum of the gallbladder was dissected off the cystic plate.  A critical view of safety was obtained with the cystic duct and cystic artery being cleared of connective tissues and clearly the only two structures entering into the gallbladder with the liver  clearly visible behind.  Clips were then applied to the cystic duct and cystic artery and then these structures were divided.  A PDS endoloop was placed on the cystic duct stump.  On  inspection, the clips and endoloop had slid off the cystic duct stump.  I placed a second endoloop on the cystic duct stump and on inspection, this provided successful ligation of the cystic duct.  The gallbladder was dissected off the cystic plate, placed in an endocatch bag and removed from the 12 mm subxiphoid port site.  The clips were inspected and appeared effective.  The cystic plate was inspected and hemostasis was obtained using electrocautery.  A suction irrigator was used to clean the operative field.  Attention was turned to closure.  The 12 mm subxiphoid port site was closed using a 0-vicryl suture on a fascial suture passer.  The abdomen was desufflated.  The skin was closed using 4-0 monocryl and dermabond.  All sponge and needle counts were correct at the conclusion of the case.    Gina Foy, [facility] General, Bariatric, & Minimally Invasive Surgery [facility], [place]

## 2023-08-14 NOTE — Progress Notes (Signed)
 Patient amb to RR and has voided. Patient now amb with mom up and down the hall. IS to 2500. Patient knows to avoid straws. Educated on splinting. She has tolerated an [place] ice and is ordering something for dinner. Medicated for pain and nausea before above activities.

## 2023-08-14 NOTE — Discharge Instructions (Signed)
 CHOLECYSTECTOMY POST OPERATIVE INSTRUCTIONS  Thinking Clearly  The anesthesia may cause you to feel different for 1 or 2 days. Do not drive, drink alcohol, or make any big decisions for at least 2 days.  Nutrition When you wake up, you will be able to drink small amounts of liquid. If you do not feel sick, you can slowly advance your diet to regular foods. Continue to drink lots of fluids, usually about 8 to 10 glasses per day. Eat a high-fiber diet so you don't strain during bowel movements. High-Fiber Foods Foods high in fiber include beans, bran cereals and whole-grain breads, peas, dried fruit (figs, apricots, and dates), raspberries, blackberries, strawberries, sweet corn, broccoli, baked potatoes with skin, plums, pears, apples, greens, and nuts. Activity Slowly increase your activity. Be sure to get up and walk every hour or so to prevent blood clots. No heavy lifting or strenuous activity for 4 weeks following surgery to prevent hernias at your incision sites It is normal to feel tired. You may need more sleep than usual.  Get your rest but make sure to get up and move around frequently to prevent blood clots and pneumonia.  Work and Return to [employer] can go back to work when you feel well enough. Discuss the timing with your surgeon. You can usually go back to school or work 1 week after an operation. If your work requires heavy lifting or strenuous activity you need to be placed on light duty for 4 weeks following surgery. You can return to gym class, sports or other physical activities 4 weeks after surgery.  Wound Care Always wash your hands before and after touching near your incision site. Do not soak in a bathtub until cleared at your follow up appointment. You may take a shower 24 hours after surgery. A small amount of drainage from the incision is normal. If the drainage is thick and yellow or the site is red, you may have an infection, so call your surgeon. If you  have a drain in one of your incisions, it will be taken out in office when the drainage stops. Steri-Strips will fall off in 7 to 10 days or they will be removed during your first office visit. If you have dermabond glue covering over the incision, allow the glue to flake off on its own. Avoid wearing tight or rough clothing. It may rub your incisions and make it harder for them to heal. Protect the new skin, especially from the sun. The sun can burn and cause darker scarring. Your scar will heal in about 4 to 6 weeks and will become softer and continue to fade over the next year.  The cosmetic appearance of the incisions will improve over the course of the first year after surgery. Sensation around your incision will return in a few weeks or months.  Bowel Movements After intestinal surgery, you may have loose watery stools for several days. If watery diarrhea lasts longer than 3 days, contact your surgeon. Pain medication (narcotics) can cause constipation. Increase the fiber in your diet with high-fiber foods if you are constipated. You can take an over the counter stool softener like Colace to avoid constipation.  Additional over the counter medications can also be used if Colace isn't sufficient (for example, Milk of Magnesia or Miralax).  Pain The amount of pain is different for each person. Some people need only 1 to 3 doses of pain control medication, while others need more. Take alternating doses of tylenol  and ibuprofen around the clock for the first five days following surgery.  This will provide a baseline of pain control and help with inflammation.  Take the narcotic pain medication in addition if needed for severe pain.  Contact Your Surgeon at [phone], if you have: Pain in your right upper abdomen like a gallbladder attack. Pain that will not go away Pain that gets worse A fever of more than 101F (38.3C) Repeated vomiting Swelling, redness, bleeding, or bad-smelling  drainage from your wound site Strong abdominal pain No bowel movement or unable to pass gas for 3 days Watery diarrhea lasting longer than 3 days  Pain Control The goal of pain control is to minimize pain, keep you moving and help you heal. Your surgical team will work with you on your pain plan. Most often a combination of therapies and medications are used to control your pain. You may also be given medication (local anesthetic) at the surgical site. This may help control your pain for several days. Extreme pain puts extra stress on your body at a time when your body needs to focus on healing. Do not wait until your pain has reached a level "10" or is unbearable before telling your doctor or nurse. It is much easier to control pain before it becomes severe. Following a laparoscopic procedure, pain is sometimes felt in the shoulder. This is due to the gas inserted into your abdomen during the procedure. Moving and walking helps to decrease the gas and the right shoulder pain.  Use the guide below for ways to manage your post-operative pain. Learn more by going to facs.org/safepaincontrol.  How Intense Is My Pain Common Therapies to Feel Better       I hardly notice my pain, and it does not interfere with my activities.  I notice my pain and it distracts me, but I can still do activities (sitting up, walking, standing).  Non-Medication Therapies  Ice (in a bag, applied over clothing at the surgical site), elevation, rest, meditation, massage, distraction (music, TV, play) walking and mild exercise Splinting the abdomen with pillows +  Non-Opioid Medications Acetaminophen (Tylenol) Non-steroidal anti-inflammatory drugs (NSAIDS) Aspirin, Ibuprofen (Motrin, Advil) Naproxen (Aleve) Take these as needed, when you feel pain. Both acetaminophen and NSAIDs help to decrease pain and swelling (inflammation).      My pain is hard to ignore and is more noticeable even when I rest.  My  pain interferes with my usual activities.  Non-Medication Therapies  +  Non-Opioid medications  Take on a regular schedule (around-the-clock) instead of as needed. (For example, Tylenol every 6 hours at 9:00 am, 3:00 pm, 9:00 pm, 3:00 am and Motrin every 6 hours at 12:00 am, 6:00 am, 12:00 pm, 6:00 pm)         I am focused on my pain, and I am not doing my daily activities.  I am groaning in pain, and I cannot sleep. I am unable to do anything.  My pain is as bad as it could be, and nothing else matters.  Non-Medication Therapies  +  Around-the-Clock Non-Opioid Medications  +  Short-acting opioids  Opioids should be used with other medications to manage severe pain. Opioids block pain and give a feeling of euphoria (feel high). Addiction, a serious side effect of opioids, is rare with short-term (a few days) use.  Examples of short-acting opioids include: Tramadol (Ultram), Hydrocodone (Norco, Vicodin), Hydromorphone (Dilaudid), Oxycodone (Oxycontin)     The above directions have been adapted from  the [employer] Surgical Patient Education Program.  Please refer to the ACS website if needed: [email or web address].   Ivar Drape, MD [facility] [address], [address], [place], [place]  [address] ?  [address] [address], [place], [place]   [address] [phone] ? [phone] ? FAX [phone] Web site: [email or web address]

## 2023-08-15 LAB — BASIC METABOLIC PANEL WITH GFR
Anion gap: 7 (ref 5–15)
BUN: 6 mg/dL (ref 6–20)
CO2: 24 mmol/L (ref 22–32)
Calcium: 8.4 mg/dL — ABNORMAL LOW (ref 8.9–10.3)
Chloride: 106 mmol/L (ref 98–111)
Creatinine, Ser: 0.6 mg/dL (ref 0.44–1.00)
GFR, Estimated: >60 mL/min (ref >60)
Glucose, Bld: 90 mg/dL (ref 70–99)
Potassium: 3.6 mmol/L (ref 3.5–5.1)
Sodium: 137 mmol/L (ref 135–145)

## 2023-08-15 LAB — CBC
HCT: 34.2 % — ABNORMAL LOW (ref 36.0–46.0)
Hemoglobin: 10.9 g/dL — ABNORMAL LOW (ref 12.0–15.0)
MCH: 30.4 pg (ref 26.0–34.0)
MCHC: 31.9 g/dL (ref 30.0–36.0)
MCV: 95.3 fL (ref 80.0–100.0)
Platelets: 246 K/uL (ref 150–400)
RBC: 3.59 MIL/uL — ABNORMAL LOW (ref 3.87–5.11)
RDW: 12.6 % (ref 11.5–15.5)
WBC: 8.9 K/uL (ref 4.0–10.5)
nRBC: 0 % (ref 0.0–0.2)

## 2023-08-15 LAB — HEPATIC FUNCTION PANEL
ALT: 24 U/L (ref 0–44)
AST: 26 U/L (ref 15–41)
Albumin: 3 g/dL — ABNORMAL LOW (ref 3.5–5.0)
Alkaline Phosphatase: 51 U/L (ref 38–126)
Bilirubin, Direct: <0.1 mg/dL (ref 0.0–0.2)
Total Bilirubin: 0.6 mg/dL (ref 0.0–1.2)
Total Protein: 5.7 g/dL — ABNORMAL LOW (ref 6.5–8.1)

## 2023-08-15 NOTE — Discharge Summary (Signed)
  Patient ID: Gina Lynn [id number] 33 y.o. 08-02-1990  08/13/2023  Discharge date and time: 08/15/2023  Admitting Physician: Deward PARAS Dannell Raczkowski  Discharge Physician: Deward PARAS Hali Balgobin  Admission Diagnoses: Acute cholecystitis [K81.0] Calculus of gallbladder with cholecystitis without biliary obstruction, unspecified cholecystitis acuity [K80.10] Nausea and vomiting, unspecified vomiting type [R11.2] Patient Active Problem List   Diagnosis Date Noted   Acute cholecystitis 08/13/2023   Asthma, mild intermittent 08/10/2012     Discharge Diagnoses:  Patient Active Problem List   Diagnosis Date Noted   Acute cholecystitis 08/13/2023   Asthma, mild intermittent 08/10/2012    Operations: Procedure(s): LAPAROSCOPIC CHOLECYSTECTOMY  Admission Condition: good  Discharged Condition: good  Indication for Admission: Cholecystitis  Hospital Course: Laparoscopic cholecystectomy  Consults: None  Significant Diagnostic Studies: None  Treatments: surgery: As above  Disposition: Home  Patient Instructions:  Allergies as of 08/15/2023   No Known Allergies      Medication List     TAKE these medications    albuterol  108 (90 Base) MCG/ACT inhaler Commonly known as: VENTOLIN  HFA Inhale 1-2 puffs into the lungs every 6 (six) hours as needed for wheezing or shortness of breath.   fluticasone  50 MCG/ACT nasal spray Commonly known as: FLONASE  Place 2 sprays into both nostrils daily.   oxyCODONE -acetaminophen  5-325 MG tablet Commonly known as: Percocet Take 1 tablet by mouth every 4 (four) hours as needed for severe pain (pain score 7-10).   penicillin  v potassium 500 MG tablet Commonly known as: VEETID Take 500 mg by mouth 3 (three) times daily.        Activity: no heavy lifting for 4 weeks Diet: regular diet Wound Care: keep wound clean and dry  Follow-up:  With Dr. Lyndel  Signed: Deward PARAS Angie Piercey General, Bariatric, & Minimally Invasive  Surgery [facility], [place]   08/15/2023, 9:53 AM

## 2023-08-16 ENCOUNTER — Encounter (HOSPITAL_COMMUNITY): Payer: Self-pay | Admitting: Surgery

## 2023-08-16 NOTE — Anesthesia Postprocedure Evaluation (Signed)
 Anesthesia Post Note  Patient: Gina Lynn  Procedure(s) Performed: LAPAROSCOPIC CHOLECYSTECTOMY (Abdomen)     Patient location during evaluation: PACU Anesthesia Type: General Level of consciousness: awake and alert Pain management: pain level controlled Vital Signs Assessment: post-procedure vital signs reviewed and stable Respiratory status: spontaneous breathing, nonlabored ventilation, respiratory function stable and patient connected to nasal cannula oxygen Cardiovascular status: blood pressure returned to baseline and stable Postop Assessment: no apparent nausea or vomiting Anesthetic complications: no   No notable events documented.  Last Vitals:  Vitals:   08/14/23 1954 08/15/23 0420  BP: 109/62 (!) 110/58  Pulse: (!) 58 69  Resp: 16 16  Temp: 37 C 37.1 C  SpO2: 99% 100%    Last Pain:  Vitals:   08/15/23 1137  TempSrc:   PainSc: 8                  [name] S

## 2023-08-18 LAB — SURGICAL PATHOLOGY
